# Patient Record
Sex: Female | Born: 1985 | Race: Black or African American | Hispanic: No | Marital: Single | Smoking: Former smoker
Health system: Southern US, Community
[De-identification: ages and names within clinical notes are randomized; demographics above are authoritative.]

## PROBLEM LIST (undated history)

## (undated) DIAGNOSIS — I10 Essential (primary) hypertension: Secondary | ICD-10-CM

## (undated) HISTORY — PX: DILATION AND CURETTAGE OF UTERUS: SHX78

## (undated) HISTORY — PX: TONSILLECTOMY AND ADENOIDECTOMY: SHX28

---
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
## 2007-01-10 ENCOUNTER — Emergency Department: Payer: Self-pay | Admitting: Emergency Medicine

## 2007-08-21 ENCOUNTER — Ambulatory Visit: Payer: Self-pay | Admitting: Family Medicine

## 2007-08-29 ENCOUNTER — Ambulatory Visit: Payer: Self-pay | Admitting: Family Medicine

## 2007-09-07 ENCOUNTER — Ambulatory Visit: Payer: Self-pay | Admitting: Family Medicine

## 2007-09-12 ENCOUNTER — Emergency Department: Payer: Self-pay | Admitting: Emergency Medicine

## 2007-10-08 ENCOUNTER — Ambulatory Visit: Payer: Self-pay | Admitting: Family Medicine

## 2007-11-07 ENCOUNTER — Ambulatory Visit: Payer: Self-pay | Admitting: Family Medicine

## 2007-11-21 ENCOUNTER — Observation Stay: Payer: Self-pay | Admitting: Obstetrics and Gynecology

## 2007-12-08 ENCOUNTER — Ambulatory Visit: Payer: Self-pay | Admitting: Family Medicine

## 2008-01-25 ENCOUNTER — Observation Stay: Payer: Self-pay

## 2008-02-24 ENCOUNTER — Inpatient Hospital Stay: Payer: Self-pay | Admitting: Unknown Physician Specialty

## 2011-01-27 ENCOUNTER — Emergency Department: Payer: Self-pay | Admitting: Emergency Medicine

## 2011-02-07 ENCOUNTER — Emergency Department: Payer: Self-pay | Admitting: *Deleted

## 2011-02-28 ENCOUNTER — Ambulatory Visit: Payer: Self-pay | Admitting: Obstetrics and Gynecology

## 2011-03-04 ENCOUNTER — Ambulatory Visit: Payer: Self-pay | Admitting: Obstetrics and Gynecology

## 2011-03-08 LAB — PATHOLOGY REPORT

## 2014-01-28 ENCOUNTER — Emergency Department: Payer: Self-pay | Admitting: Emergency Medicine

## 2014-01-31 ENCOUNTER — Emergency Department: Payer: Self-pay | Admitting: Emergency Medicine

## 2014-07-24 ENCOUNTER — Emergency Department: Payer: Self-pay | Admitting: Emergency Medicine

## 2014-10-03 ENCOUNTER — Encounter: Payer: Self-pay | Admitting: Emergency Medicine

## 2014-10-03 ENCOUNTER — Emergency Department
Admission: EM | Admit: 2014-10-03 | Discharge: 2014-10-03 | Disposition: A | Discharge status: Home / Self Care | Payer: Medicaid Other | Attending: Emergency Medicine | Admitting: Emergency Medicine

## 2014-10-03 DIAGNOSIS — J029 Acute pharyngitis, unspecified: Secondary | ICD-10-CM | POA: Diagnosis present

## 2014-10-03 DIAGNOSIS — Z87891 Personal history of nicotine dependence: Secondary | ICD-10-CM | POA: Diagnosis not present

## 2014-10-03 DIAGNOSIS — I10 Essential (primary) hypertension: Secondary | ICD-10-CM | POA: Insufficient documentation

## 2014-10-03 DIAGNOSIS — J069 Acute upper respiratory infection, unspecified: Secondary | ICD-10-CM | POA: Insufficient documentation

## 2014-10-03 DIAGNOSIS — H9203 Otalgia, bilateral: Secondary | ICD-10-CM | POA: Insufficient documentation

## 2014-10-03 DIAGNOSIS — Z79899 Other long term (current) drug therapy: Secondary | ICD-10-CM | POA: Diagnosis not present

## 2014-10-03 HISTORY — DX: Essential (primary) hypertension: I10

## 2014-10-03 MED ORDER — LORATADINE 10 MG PO TABS: 10.0000 mg | ORAL_TABLET | Freq: Every day | ORAL | Status: DC

## 2014-10-03 NOTE — ED Notes (Signed)
Pt states ear ache and sore throat x 2 weeks. Denies diff breathing, states pain when eating or swallowing. NAD noted. Pt alert and oriented at this time.

## 2014-10-03 NOTE — Discharge Instructions (Signed)
Upper Respiratory Infection, Adult °An upper respiratory infection (URI) is also sometimes known as the common cold. The upper respiratory tract includes the nose, sinuses, throat, trachea, and bronchi. Bronchi are the airways leading to the lungs. Most people improve within 1 week, but symptoms can last up to 2 weeks. A residual cough may last even longer.  °CAUSES °Many different viruses can infect the tissues lining the upper respiratory tract. The tissues become irritated and inflamed and often become very moist. Mucus production is also common. A cold is contagious. You can easily spread the virus to others by oral contact. This includes kissing, sharing a glass, coughing, or sneezing. Touching your mouth or nose and then touching a surface, which is then touched by another person, can also spread the virus. °SYMPTOMS  °Symptoms typically develop 1 to 3 days after you come in contact with a cold virus. Symptoms vary from person to person. They may include: °· Runny nose. °· Sneezing. °· Nasal congestion. °· Sinus irritation. °· Sore throat. °· Loss of voice (laryngitis). °· Cough. °· Fatigue. °· Muscle aches. °· Loss of appetite. °· Headache. °· Low-grade fever. °DIAGNOSIS  °You might diagnose your own cold based on familiar symptoms, since most people get a cold 2 to 3 times a year. Your caregiver can confirm this based on your exam. Most importantly, your caregiver can check that your symptoms are not due to another disease such as strep throat, sinusitis, pneumonia, asthma, or epiglottitis. Blood tests, throat tests, and X-rays are not necessary to diagnose a common cold, but they may sometimes be helpful in excluding other more serious diseases. Your caregiver will decide if any further tests are required. °RISKS AND COMPLICATIONS  °You may be at risk for a more severe case of the common cold if you smoke cigarettes, have chronic heart disease (such as heart failure) or lung disease (such as asthma), or if  you have a weakened immune system. The very young and very old are also at risk for more serious infections. Bacterial sinusitis, middle ear infections, and bacterial pneumonia can complicate the common cold. The common cold can worsen asthma and chronic obstructive pulmonary disease (COPD). Sometimes, these complications can require emergency medical care and may be life-threatening. °PREVENTION  °The best way to protect against getting a cold is to practice good hygiene. Avoid oral or hand contact with people with cold symptoms. Wash your hands often if contact occurs. There is no clear evidence that vitamin C, vitamin E, echinacea, or exercise reduces the chance of developing a cold. However, it is always recommended to get plenty of rest and practice good nutrition. °TREATMENT  °Treatment is directed at relieving symptoms. There is no cure. Antibiotics are not effective, because the infection is caused by a virus, not by bacteria. Treatment may include: °· Increased fluid intake. Sports drinks offer valuable electrolytes, sugars, and fluids. °· Breathing heated mist or steam (vaporizer or shower). °· Eating chicken soup or other clear broths, and maintaining good nutrition. °· Getting plenty of rest. °· Using gargles or lozenges for comfort. °· Controlling fevers with ibuprofen or acetaminophen as directed by your caregiver. °· Increasing usage of your inhaler if you have asthma. °Zinc gel and zinc lozenges, taken in the first 24 hours of the common cold, can shorten the duration and lessen the severity of symptoms. Pain medicines may help with fever, muscle aches, and throat pain. A variety of non-prescription medicines are available to treat congestion and runny nose. Your caregiver   can make recommendations and may suggest nasal or lung inhalers for other symptoms.  HOME CARE INSTRUCTIONS   Only take over-the-counter or prescription medicines for pain, discomfort, or fever as directed by your  caregiver.  Use a warm mist humidifier or inhale steam from a shower to increase air moisture. This may keep secretions moist and make it easier to breathe.  Drink enough water and fluids to keep your urine clear or pale yellow.  Rest as needed.  Return to work when your temperature has returned to normal or as your caregiver advises. You may need to stay home longer to avoid infecting others. You can also use a face mask and careful hand washing to prevent spread of the virus. SEEK MEDICAL CARE IF:   After the first few days, you feel you are getting worse rather than better.  You need your caregiver's advice about medicines to control symptoms.  You develop chills, worsening shortness of breath, or brown or red sputum. These may be signs of pneumonia.  You develop yellow or brown nasal discharge or pain in the face, especially when you bend forward. These may be signs of sinusitis.  You develop a fever, swollen neck glands, pain with swallowing, or white areas in the back of your throat. These may be signs of strep throat. SEEK IMMEDIATE MEDICAL CARE IF:   You have a fever.  You develop severe or persistent headache, ear pain, sinus pain, or chest pain.  You develop wheezing, a prolonged cough, cough up blood, or have a change in your usual mucus (if you have chronic lung disease).  You develop sore muscles or a stiff neck. Document Released: 10/19/2000 Document Revised: 07/18/2011 Document Reviewed: 07/31/2013 [facility] Patient Information 2015 [place]. This information is not intended to replace advice given to you by your health care provider. Make sure you discuss any questions you have with your health care provider.    FOLLOW UP WITH YOUR DOCTOR ABOUT YOUR BLOOD PRESSURE NEXT WEEK AND FOLLOW UP ABOUT YOUR EAR AND THROAT PAIN WITH YOUR DOCTOR

## 2014-10-03 NOTE — ED Provider Notes (Signed)
[facility] Emergency Department Provider Note  ____________________________________________  Time seen: Approximately 4:17 PM  I have reviewed the triage vital signs and the nursing notes.   HISTORY  Chief Complaint Otalgia and Sore Throat   HPI Amber Burnett is a 29 y.o. female states that her ears and throat have been hurting for approximately 2 weeks. She's been taking Tylenol for the pain without relief.She denies any fever, any difficulty swallowing, or any trouble speaking. Currently she rates her pain as 9 out of 10.   Past Medical History  Diagnosis Date  . HTN (hypertension)     There are no active problems to display for this patient.   History reviewed. No pertinent past surgical history.  Current Outpatient Rx  Name  Route  Sig  Dispense  Refill  . atenolol (TENORMIN) 25 MG tablet   Oral   Take by mouth daily.         . hydrochlorothiazide (HYDRODIURIL) 25 MG tablet   Oral   Take 25 mg by mouth daily.         Marland Kitchen loratadine (CLARITIN) 10 MG tablet   Oral   Take 1 tablet (10 mg total) by mouth daily.   30 tablet   2     Allergies Review of patient's allergies indicates no known allergies.  History reviewed. No pertinent family history.  Social History History  Substance Use Topics  . Smoking status: Former Games developer  . Smokeless tobacco: Never Used  . Alcohol Use: No    Review of Systems Constitutional: No fever/chills Eyes: No visual changes. ENT: Positive sore throat. Cardiovascular: Denies chest pain. Respiratory: Denies shortness of breath. Gastrointestinal: No abdominal pain.  No nausea, no vomiting.  No diarrhea.  No constipation. Skin: Negative for rash. Neurological: Negative for headaches, focal weakness or numbness.  10-point ROS otherwise negative.  ____________________________________________   PHYSICAL EXAM:  VITAL SIGNS: ED Triage Vitals  Enc Vitals Group     BP 10/03/14 1542 154/103  mmHg     Pulse Rate 10/03/14 1542 82     Resp 10/03/14 1542 20     Temp 10/03/14 1542 98.3 F (36.8 C)     Temp Source 10/03/14 1542 Oral     SpO2 10/03/14 1542 100 %     Weight 10/03/14 1542 245 lb (111.131 kg)     Height 10/03/14 1542  (1.6 m)     Head Cir --      Peak Flow --      Pain Score 10/03/14 1542 9     Pain Loc --      Pain Edu? --      Excl. in GC? --     Constitutional: Alert and oriented. Well appearing and in no acute distress. Eyes: Conjunctivae are normal. PERRL. EOMI. Head: Atraumatic. Nose: No congestion/rhinnorhea. Nose mucosa is pale and boggy. Ears bilaterally no erythema. Mouth/Throat: Mucous membranes are moist.  Oropharynx non-erythematous. Neck: No stridor.   Hematological/Lymphatic/Immunilogical: No cervical lymphadenopathy. Cardiovascular: Normal rate, regular rhythm. Grossly normal heart sounds.  Good peripheral circulation. Respiratory: Normal respiratory effort.  No retractions. Lungs CTAB. Gastrointestinal: Soft and nontender. No distention.  Musculoskeletal: No lower extremity tenderness nor edema.  No joint effusions. Neurologic:  Normal speech and language. No gross focal neurologic deficits are appreciated. Speech is normal. No gait instability. Skin:  Skin is warm, dry and intact. No rash noted. Psychiatric: Mood and affect are normal. Speech and behavior are normal.  ____________________________________________  LABS (all labs ordered are listed, but only abnormal results are displayed)  Labs Reviewed - No data to display  PROCEDURES  Procedure(s) performed: None  Critical Care performed: No  ____________________________________________   INITIAL IMPRESSION / ASSESSMENT AND PLAN / ED COURSE  Pertinent labs & imaging results that were available during my care of the patient were reviewed by me and considered in my medical decision making (see chart for details).  She was given a prescription for Claritin one every day.  He is to follow-up with her doctor if any continued problems. Her blood pressure today was elevated. She states she is taking blood pressure medication and will also follow-up with her doctor next week for that. ____________________________________________   FINAL CLINICAL IMPRESSION(S) / ED DIAGNOSES  Final diagnoses:  URI (upper respiratory infection)      Tommi RumpsRhonda L Summers, PA-C 10/03/14 1635  Tommi Rumpshonda L Summers, PA-C 10/03/14 1643 Is available for consult at a time the patient is here  Arnaldo NatalPaul F Malinda, MD 10/03/14 1806

## 2014-11-07 ENCOUNTER — Encounter: Payer: Self-pay | Admitting: Emergency Medicine

## 2014-11-07 ENCOUNTER — Emergency Department
Admission: EM | Admit: 2014-11-07 | Discharge: 2014-11-07 | Discharge status: Against Medical Advice | Payer: Medicaid Other | Attending: Emergency Medicine | Admitting: Emergency Medicine

## 2014-11-07 DIAGNOSIS — N939 Abnormal uterine and vaginal bleeding, unspecified: Secondary | ICD-10-CM | POA: Diagnosis present

## 2014-11-07 LAB — URINALYSIS COMPLETE WITH MICROSCOPIC (ARMC ONLY)
Bacteria, UA: NONE SEEN
Bilirubin Urine: NEGATIVE
Glucose, UA: NEGATIVE mg/dL
Ketones, ur: NEGATIVE mg/dL
Leukocytes, UA: NEGATIVE
Nitrite: NEGATIVE
PROTEIN: 100 mg/dL — AB
SPECIFIC GRAVITY, URINE: 1.025 (ref 1.005–1.030)
pH: 6 (ref 5.0–8.0)

## 2014-11-07 NOTE — ED Notes (Signed)
Reports vag bleeding x 2 wks, heavier than usual with clots

## 2016-06-23 ENCOUNTER — Encounter: Payer: Self-pay | Admitting: *Deleted

## 2016-06-23 DIAGNOSIS — R509 Fever, unspecified: Secondary | ICD-10-CM | POA: Insufficient documentation

## 2016-06-23 DIAGNOSIS — Z5321 Procedure and treatment not carried out due to patient leaving prior to being seen by health care provider: Secondary | ICD-10-CM | POA: Diagnosis not present

## 2016-06-23 DIAGNOSIS — M791 Myalgia: Secondary | ICD-10-CM | POA: Diagnosis not present

## 2016-06-23 DIAGNOSIS — R05 Cough: Secondary | ICD-10-CM | POA: Insufficient documentation

## 2016-06-23 DIAGNOSIS — I10 Essential (primary) hypertension: Secondary | ICD-10-CM | POA: Diagnosis not present

## 2016-06-23 DIAGNOSIS — Z87891 Personal history of nicotine dependence: Secondary | ICD-10-CM | POA: Diagnosis not present

## 2016-06-23 NOTE — ED Triage Notes (Signed)
Pt reports cough, chills, fever and bodyaches for 1 week.  No otc meds.  Pt alert

## 2016-06-24 ENCOUNTER — Emergency Department
Admission: EM | Admit: 2016-06-24 | Discharge: 2016-06-24 | Disposition: A | Discharge status: Home / Self Care | Payer: Medicaid Other | Attending: Emergency Medicine | Admitting: Emergency Medicine

## 2016-08-29 ENCOUNTER — Telehealth: Payer: Self-pay | Admitting: Obstetrics & Gynecology

## 2016-08-29 NOTE — Telephone Encounter (Signed)
Pt is [facility] For Post[facility] Bleeding. At this time we are not excepting New medicaid patient. Unable to schedule at this time

## 2016-08-29 NOTE — Telephone Encounter (Signed)
Pt is being referred By [facility] for Menorrhagia. Correction to referral

## 2016-09-12 ENCOUNTER — Emergency Department
Admission: EM | Admit: 2016-09-12 | Discharge: 2016-09-12 | Disposition: A | Discharge status: Home / Self Care | Payer: Medicaid Other | Attending: Emergency Medicine | Admitting: Emergency Medicine

## 2016-09-12 ENCOUNTER — Encounter: Payer: Self-pay | Admitting: Emergency Medicine

## 2016-09-12 DIAGNOSIS — I10 Essential (primary) hypertension: Secondary | ICD-10-CM | POA: Insufficient documentation

## 2016-09-12 DIAGNOSIS — N938 Other specified abnormal uterine and vaginal bleeding: Secondary | ICD-10-CM | POA: Diagnosis not present

## 2016-09-12 DIAGNOSIS — N939 Abnormal uterine and vaginal bleeding, unspecified: Secondary | ICD-10-CM | POA: Diagnosis present

## 2016-09-12 DIAGNOSIS — Z79899 Other long term (current) drug therapy: Secondary | ICD-10-CM | POA: Insufficient documentation

## 2016-09-12 DIAGNOSIS — Z87891 Personal history of nicotine dependence: Secondary | ICD-10-CM | POA: Diagnosis not present

## 2016-09-12 LAB — POCT PREGNANCY, URINE: Preg Test, Ur: NEGATIVE

## 2016-09-12 LAB — CBC
HEMATOCRIT: 38 % (ref 35.0–47.0)
HEMOGLOBIN: 12.7 g/dL (ref 12.0–16.0)
MCH: 28.1 pg (ref 26.0–34.0)
MCHC: 33.3 g/dL (ref 32.0–36.0)
MCV: 84.4 fL (ref 80.0–100.0)
PLATELETS: 287 10*3/uL (ref 150–440)
RBC: 4.5 MIL/uL (ref 3.80–5.20)
RDW: 14.2 % (ref 11.5–14.5)
WBC: 5.6 10*3/uL (ref 3.6–11.0)

## 2016-09-12 MED ORDER — MEDROXYPROGESTERONE ACETATE 10 MG PO TABS: 20.0000 mg | ORAL_TABLET | Freq: Every day | ORAL | 0 refills | Status: DC

## 2016-09-12 NOTE — ED Triage Notes (Signed)
Pt c/o vaginal bleeding, approx 1 pad per hours for approx 1 week. Pt reports she has hx of this and it was diagnosed as a polyp before. Reports cramping. No syncope or dizziness. Pt alert and oriented X4, active, cooperative, pt in NAD. RR even and unlabored, color WNL.

## 2016-09-12 NOTE — ED Notes (Signed)
Pt ambulatory to ER. Pt alert and oriented X4, active, cooperative, pt in NAD. RR even and unlabored, color WNL.

## 2016-09-12 NOTE — ED Provider Notes (Signed)
[facility] Emergency Department Provider Note  ____________________________________________  Time seen: Approximately 5:52 PM  I have reviewed the triage vital signs and the nursing notes.   HISTORY  Chief Complaint Vaginal Bleeding    HPI Amber Burnett is a 31 y.o. female who complains of irregular vaginal bleeding. She states that she has irregular periods sometimes going a few months or a two-year between periods ever since she had a Mirena IUD. Denies any breast tenderness or enlargement. No cramping pelvic pain or back pain. She's been having vaginal bleeding for the last week. She noticed clots and having to change her pad frequently. It is not worsening, somewhat improved today. No vomiting fevers chills chest pain shortness of breath dizziness or syncope.  Prior to current bleeding episode last period was 2 months ago.  Patient has a follow-up appointment with gynecology in one week. She reports a strong family history of uterine fibroids and a previous diagnosis of uterine fibroid herself.   Past Medical History:  Diagnosis Date  . HTN (hypertension)      There are no active problems to display for this patient.    History reviewed. No pertinent surgical history.   Prior to Admission medications   Medication Sig Start Date End Date Taking? Authorizing Provider  atenolol (TENORMIN) 25 MG tablet Take by mouth daily.    [provider]  hydrochlorothiazide (HYDRODIURIL) 25 MG tablet Take 25 mg by mouth daily.    [provider]  loratadine (CLARITIN) 10 MG tablet Take 1 tablet (10 mg total) by mouth daily. 10/03/14 10/03/15  Tommi RumpsSummers, Rhonda L, PA-C  medroxyPROGESTERone (PROVERA) 10 MG tablet Take 2 tablets (20 mg total) by mouth daily. 09/12/16   Sharman CheekStafford, Madelina Sanda, MD     Allergies Patient has no known allergies.   No family history on file.  Social History Social History  Substance Use Topics  . Smoking status:  Former Games developermoker  . Smokeless tobacco: Never Used  . Alcohol use No    Review of Systems  Constitutional:   No fever or chills.  ENT:   No sore throat. No rhinorrhea. Lymphatic: No swollen glands, No extremity swelling Endocrine: No hot/cold flashes. No significant weight change. No neck swelling. Cardiovascular:   No chest pain or syncope. Respiratory:   No dyspnea or cough. Gastrointestinal:   Negative for abdominal pain, vomiting and diarrhea.  Genitourinary:   Negative for dysuria or difficulty urinating.Positive vaginal bleeding Musculoskeletal:   Negative for focal pain or swelling Neurological:   Negative for headaches or weakness. All other systems reviewed and are negative except as documented above in ROS and HPI.  ____________________________________________   PHYSICAL EXAM:  VITAL SIGNS: ED Triage Vitals  Enc Vitals Group     BP 09/12/16 1231 (!) 151/103     Pulse Rate 09/12/16 1231 82     Resp 09/12/16 1231 20     Temp 09/12/16 1231 98.8 F (37.1 C)     Temp Source 09/12/16 1231 Oral     SpO2 09/12/16 1231 97 %     Weight 09/12/16 1231 292 lb 4.8 oz (132.6 kg)     Height 09/12/16 1231 5\' 3"  (1.6 m)     Head Circumference --      Peak Flow --      Pain Score 09/12/16 1240 10     Pain Loc --      Pain Edu? --      Excl. in GC? --  Vital signs reviewed, nursing assessments reviewed.   Constitutional:   Alert and oriented. Well appearing and in no distress. Eyes:   No scleral icterus. No conjunctival pallor. PERRL. EOMI.  No nystagmus. ENT   Head:   Normocephalic and atraumatic.   Nose:   No congestion/rhinnorhea. No septal hematoma   Mouth/Throat:   MMM, no pharyngeal erythema. No peritonsillar mass.    Neck:   No stridor. No SubQ emphysema. No meningismus. Hematological/Lymphatic/Immunilogical:   No cervical lymphadenopathy. Cardiovascular:   RRR. Symmetric bilateral radial and DP pulses.  No murmurs.  Respiratory:   Normal respiratory  effort without tachypnea nor retractions. Breath sounds are clear and equal bilaterally. No wheezes/rales/rhonchi. Gastrointestinal:   Soft and nontender. Non distended. There is no CVA tenderness.  No rebound, rigidity, or guarding. Genitourinary:   deferred Musculoskeletal:   Normal range of motion in all extremities. No joint effusions.  No lower extremity tenderness.  No edema. Neurologic:   Normal speech and language.  CN 2-10 normal. Motor grossly intact. No gross focal neurologic deficits are appreciated.  Skin:    Skin is warm, dry and intact. No rash noted.  No petechiae, purpura, or bullae.  ____________________________________________    LABS (pertinent positives/negatives) (all labs ordered are listed, but only abnormal results are displayed) Labs Reviewed  CBC  POC URINE PREG, ED   ____________________________________________   EKG    ____________________________________________    RADIOLOGY  No results found.  ____________________________________________   PROCEDURES Procedures  ____________________________________________   INITIAL IMPRESSION / ASSESSMENT AND PLAN / ED COURSE  Pertinent labs & imaging results that were available during my care of the patient were reviewed by me and considered in my medical decision making (see chart for details).  Patient well appearing no acute distress, normal vital signs, no worrisome symptoms. Presents with vaginal bleeding for a week. Will start her on a week of Provera. Follow up with gynecology as scheduled in 8 days on May 15. Return precautions.  We'll ensure she is not pregnant prior to discharge.       ____________________________________________   FINAL CLINICAL IMPRESSION(S) / ED DIAGNOSES  Final diagnoses:  Dysfunctional uterine bleeding      New Prescriptions   MEDROXYPROGESTERONE (PROVERA) 10 MG TABLET    Take 2 tablets (20 mg total) by mouth daily.     Portions of this note were  generated with dragon dictation software. Dictation errors may occur despite best attempts at proofreading.    Sharman Cheek, MD 09/12/16 1755

## 2017-07-17 ENCOUNTER — Emergency Department
Admission: EM | Admit: 2017-07-17 | Discharge: 2017-07-17 | Disposition: A | Discharge status: Home / Self Care | Payer: Medicaid Other | Attending: Emergency Medicine | Admitting: Emergency Medicine

## 2017-07-17 ENCOUNTER — Encounter: Payer: Self-pay | Admitting: Emergency Medicine

## 2017-07-17 ENCOUNTER — Emergency Department: Payer: Medicaid Other

## 2017-07-17 DIAGNOSIS — J40 Bronchitis, not specified as acute or chronic: Secondary | ICD-10-CM

## 2017-07-17 DIAGNOSIS — Z87891 Personal history of nicotine dependence: Secondary | ICD-10-CM | POA: Diagnosis not present

## 2017-07-17 DIAGNOSIS — Z79899 Other long term (current) drug therapy: Secondary | ICD-10-CM | POA: Insufficient documentation

## 2017-07-17 DIAGNOSIS — R05 Cough: Secondary | ICD-10-CM | POA: Diagnosis present

## 2017-07-17 DIAGNOSIS — I10 Essential (primary) hypertension: Secondary | ICD-10-CM | POA: Diagnosis not present

## 2017-07-17 MED ORDER — AZITHROMYCIN 250 MG PO TABS: ORAL_TABLET | ORAL | 0 refills | Status: AC

## 2017-07-17 MED ORDER — SULFAMETHOXAZOLE-TRIMETHOPRIM 800-160 MG PO TABS
1.0000 | ORAL_TABLET | Freq: Once | ORAL | Status: AC
  Administered 2017-07-17: 1 via ORAL
  Filled 2017-07-17: qty 1

## 2017-07-17 MED ORDER — BENZONATATE 100 MG PO CAPS
200.0000 mg | ORAL_CAPSULE | Freq: Once | ORAL | Status: AC
  Administered 2017-07-17: 200 mg via ORAL
  Filled 2017-07-17: qty 2

## 2017-07-17 MED ORDER — PSEUDOEPH-BROMPHEN-DM 30-2-10 MG/5ML PO SYRP: 5.0000 mL | ORAL_SOLUTION | Freq: Four times a day (QID) | ORAL | 0 refills | Status: DC | PRN

## 2017-07-17 MED ORDER — NAPROXEN 500 MG PO TABS: 500.0000 mg | ORAL_TABLET | Freq: Two times a day (BID) | ORAL | 0 refills | Status: AC

## 2017-07-17 MED ORDER — NAPROXEN 500 MG PO TABS
500.0000 mg | ORAL_TABLET | Freq: Once | ORAL | Status: AC
  Administered 2017-07-17: 500 mg via ORAL
  Filled 2017-07-17: qty 1

## 2017-07-17 NOTE — ED Triage Notes (Signed)
Pt reports cough, congestion, bodyaches and fever since the weekend. Pt concerned she has the flu.

## 2017-07-17 NOTE — ED Notes (Signed)
See triage note  Presents with body aches and low grade fever since Friday  Afebrile on arrival

## 2017-07-17 NOTE — Discharge Instructions (Signed)
Recommended 3-day blood pressure check secondary to elevated readings today of 160/101.

## 2017-07-17 NOTE — ED Provider Notes (Signed)
[facility] Emergency Department Provider Note   ____________________________________________   First MD Initiated Contact with Patient 07/17/17 1509     (approximate)  I have reviewed the triage vital signs and the nursing notes.   HISTORY  Chief Complaint Influenza; Cough; Fever; and Generalized Body Aches    HPI Amber Burnett is a 32 y.o. female patient complain of cough, fever, and body aches for 3 days.  Patient denies nausea, vomiting, diarrhea.  Patient has not taken a flu shot for this season.  Patient rates the pain as a 7/10.  No palliative measure for complaint.  Patient BP was elevated in triage.  Patient has a history of hypertension but states she was told to discontinue blood pressure medicine 1-2 years ago.  Past Medical History:  Diagnosis Date  . HTN (hypertension)     There are no active problems to display for this patient.   History reviewed. No pertinent surgical history.  Prior to Admission medications   Medication Sig Start Date End Date Taking? Authorizing Provider  atenolol (TENORMIN) 25 MG tablet Take by mouth daily.    [provider]  azithromycin (ZITHROMAX Z-PAK) 250 MG tablet Take 2 tablets (500 mg) on  Day 1,  followed by 1 tablet (250 mg) once daily on Days 2 through 5. 07/17/17 07/22/17  Joni Reining, PA-C  brompheniramine-pseudoephedrine-DM 30-2-10 MG/5ML syrup Take 5 mLs by mouth 4 (four) times daily as needed. 07/17/17   Joni Reining, PA-C  hydrochlorothiazide (HYDRODIURIL) 25 MG tablet Take 25 mg by mouth daily.    [provider]  loratadine (CLARITIN) 10 MG tablet Take 1 tablet (10 mg total) by mouth daily. 10/03/14 10/03/15  Tommi Rumps, PA-C  medroxyPROGESTERone (PROVERA) 10 MG tablet Take 2 tablets (20 mg total) by mouth daily. 09/12/16   Sharman Cheek, MD  naproxen (NAPROSYN) 500 MG tablet Take 1 tablet (500 mg total) by mouth 2 (two) times daily with a meal. 07/17/17 07/17/18   Joni Reining, PA-C    Allergies Patient has no known allergies.  No family history on file.  Social History Social History   Tobacco Use  . Smoking status: Former Games developer  . Smokeless tobacco: Never Used  Substance Use Topics  . Alcohol use: No  . Drug use: No    Review of Systems Constitutional: No fever/chills Eyes: No visual changes. ENT: No sore throat.  Nasal congestion. Cardiovascular: Denies chest pain. Respiratory: Denies shortness of breath.  Productive cough. Gastrointestinal: No abdominal pain.  No nausea, no vomiting.  No diarrhea.  No constipation. Genitourinary: Negative for dysuria. Musculoskeletal: Negative for back pain. Skin: Negative for rash. Neurological: Positive for frontal headache headaches, denies focal weakness or numbness. ____________________________________________   PHYSICAL EXAM:  VITAL SIGNS: ED Triage Vitals  Enc Vitals Group     BP 07/17/17 1405 (!) 160/101     Pulse Rate 07/17/17 1405 89     Resp 07/17/17 1405 20     Temp 07/17/17 1405 98.8 F (37.1 C)     Temp Source 07/17/17 1405 Oral     SpO2 07/17/17 1405 97 %     Weight 07/17/17 1406 295 lb (133.8 kg)     Height 07/17/17 1406 5\' 3"  (1.6 m)     Head Circumference --      Peak Flow --      Pain Score 07/17/17 1406 7     Pain Loc --      Pain Edu? --  Excl. in GC? --    Constitutional: Alert and oriented. Well appearing and in no acute distress. Nose: Edematous nasal turbinates clear rhinorrhea. Mouth/Throat: Mucous membranes are moist.  Oropharynx non-erythematous.  Postnasal drainage. Neck: No stridor.  No cervical spine tenderness to palpation. Hematological/Lymphatic/Immunilogical: No cervical lymphadenopathy. Cardiovascular: Normal rate, regular rhythm. Grossly normal heart sounds.  Good peripheral circulation.  Elevated blood pressure Respiratory: Normal respiratory effort.  No retractions. Lungs left rales. Gastrointestinal: Soft and nontender. No  distention. No abdominal bruits. No CVA tenderness. Musculoskeletal: No lower extremity tenderness nor edema.  No joint effusions. Neurologic:  Normal speech and language. No gross focal neurologic deficits are appreciated. No gait instability. Skin:  Skin is warm, dry and intact. No rash noted. Psychiatric: Mood and affect are normal. Speech and behavior are normal.  ____________________________________________   LABS (all labs ordered are listed, but only abnormal results are displayed)  Labs Reviewed - No data to display ____________________________________________  EKG   ____________________________________________  RADIOLOGY  ED MD interpretation:   No acute findings on chest x-ray.  Official radiology report(s): Dg Chest 2 View  Result Date: 07/17/2017 CLINICAL DATA:  Cough, fever. EXAM: CHEST - 2 VIEW COMPARISON:  Radiographs of August 25, 2005. FINDINGS: The heart size and mediastinal contours are within normal limits. Both lungs are clear. No pneumothorax or pleural effusion is noted. The visualized skeletal structures are unremarkable. IMPRESSION: No active cardiopulmonary disease. Electronically Signed   By: Lupita RaiderJames  Green Jr, M.D.   On: 07/17/2017 15:31    ____________________________________________   PROCEDURES  Procedure(s) performed: None  Procedures  Critical Care performed: No  ____________________________________________   INITIAL IMPRESSION / ASSESSMENT AND PLAN / ED COURSE  As part of my medical decision making, I reviewed the following data within the electronic MEDICAL RECORD NUMBER    Respiratory infection secondary to bronchitis.  Discussed x-ray findings with patient.  Patient given discharge care instruction advised take medication as directed.  Patient advised to follow-up PCP for further evaluation of elevated blood pressure readings a day.      ____________________________________________   FINAL CLINICAL IMPRESSION(S) / ED  DIAGNOSES  Final diagnoses:  Bronchitis     ED Discharge Orders        Ordered    azithromycin (ZITHROMAX Z-PAK) 250 MG tablet     07/17/17 1554    naproxen (NAPROSYN) 500 MG tablet  2 times daily with meals     07/17/17 1554    brompheniramine-pseudoephedrine-DM 30-2-10 MG/5ML syrup  4 times daily PRN     07/17/17 1554       Note:  This document was prepared using Dragon voice recognition software and may include unintentional dictation errors.    Joni ReiningSmith, Ronald K, PA-C 07/17/17 1559    Dionne BucySiadecki, Sebastian, MD 07/17/17 1704

## 2018-06-07 ENCOUNTER — Emergency Department
Admission: EM | Admit: 2018-06-07 | Discharge: 2018-06-07 | Disposition: A | Discharge status: Home / Self Care | Payer: Medicaid Other | Attending: Emergency Medicine | Admitting: Emergency Medicine

## 2018-06-07 ENCOUNTER — Other Ambulatory Visit: Payer: Self-pay

## 2018-06-07 ENCOUNTER — Encounter: Payer: Self-pay | Admitting: Emergency Medicine

## 2018-06-07 DIAGNOSIS — R05 Cough: Secondary | ICD-10-CM | POA: Diagnosis not present

## 2018-06-07 DIAGNOSIS — J069 Acute upper respiratory infection, unspecified: Secondary | ICD-10-CM | POA: Insufficient documentation

## 2018-06-07 DIAGNOSIS — Z87891 Personal history of nicotine dependence: Secondary | ICD-10-CM | POA: Insufficient documentation

## 2018-06-07 DIAGNOSIS — I10 Essential (primary) hypertension: Secondary | ICD-10-CM | POA: Diagnosis not present

## 2018-06-07 DIAGNOSIS — R0981 Nasal congestion: Secondary | ICD-10-CM | POA: Diagnosis present

## 2018-06-07 DIAGNOSIS — Z79899 Other long term (current) drug therapy: Secondary | ICD-10-CM | POA: Diagnosis not present

## 2018-06-07 DIAGNOSIS — R519 Headache, unspecified: Secondary | ICD-10-CM

## 2018-06-07 DIAGNOSIS — R51 Headache: Secondary | ICD-10-CM | POA: Diagnosis not present

## 2018-06-07 LAB — BASIC METABOLIC PANEL
Anion gap: 7 (ref 5–15)
BUN: 16 mg/dL (ref 6–20)
CO2: 25 mmol/L (ref 22–32)
Calcium: 9 mg/dL (ref 8.9–10.3)
Chloride: 106 mmol/L (ref 98–111)
Creatinine, Ser: 0.72 mg/dL (ref 0.44–1.00)
GFR calc Af Amer: >60 mL/min (ref >60)
GFR calc non Af Amer: >60 mL/min (ref >60)
Glucose, Bld: 99 mg/dL (ref 70–99)
POTASSIUM: 3.8 mmol/L (ref 3.5–5.1)
Sodium: 138 mmol/L (ref 135–145)

## 2018-06-07 LAB — CBC
HCT: 41.6 % (ref 36.0–46.0)
Hemoglobin: 13.4 g/dL (ref 12.0–15.0)
MCH: 27.7 pg (ref 26.0–34.0)
MCHC: 32.2 g/dL (ref 30.0–36.0)
MCV: 86.1 fL (ref 80.0–100.0)
Platelets: 307 10*3/uL (ref 150–400)
RBC: 4.83 MIL/uL (ref 3.87–5.11)
RDW: 14.4 % (ref 11.5–15.5)
WBC: 7.2 10*3/uL (ref 4.0–10.5)
nRBC: 0 % (ref 0.0–0.2)

## 2018-06-07 LAB — URINALYSIS, COMPLETE (UACMP) WITH MICROSCOPIC
BILIRUBIN URINE: NEGATIVE
Glucose, UA: NEGATIVE mg/dL
Ketones, ur: NEGATIVE mg/dL
Leukocytes, UA: NEGATIVE
NITRITE: NEGATIVE
Protein, ur: NEGATIVE mg/dL
Specific Gravity, Urine: 1.021 (ref 1.005–1.030)
pH: 5 (ref 5.0–8.0)

## 2018-06-07 LAB — POCT PREGNANCY, URINE: PREG TEST UR: NEGATIVE

## 2018-06-07 MED ORDER — HYDROCHLOROTHIAZIDE 25 MG PO TABS: 25.0000 mg | ORAL_TABLET | Freq: Every day | ORAL | 2 refills | Status: AC

## 2018-06-07 MED ORDER — BUTALBITAL-APAP-CAFFEINE 50-325-40 MG PO TABS: 1.0000 | ORAL_TABLET | Freq: Four times a day (QID) | ORAL | 0 refills | Status: DC | PRN

## 2018-06-07 NOTE — ED Triage Notes (Signed)
Says headache and feels weak since Monday.  Says body feels weird.

## 2018-06-07 NOTE — ED Provider Notes (Signed)
[facility] Emergency Department Provider Note  Time seen: 2:17 PM  I have reviewed the triage vital signs and the nursing notes.   HISTORY  Chief Complaint Headache and Weakness    HPI Amber Burnett is a 33 y.o. female with a past medical history of hypertension presents to the emergency department for congestion and headache for the past 3 to 4 days but according to the patient she has had congestion with a slight cough over the past several days along with the intermittent headache.  Patient states she has had this headache before when her blood pressure goes up, used to be on hydrochlorothiazide but is no longer on this medication because she does not follow-up with a primary care doctor currently.  Patient denies any fever, denies any abdominal pain or chest pain.  Overall the patient appears very well with a overall negative review of systems otherwise.   Past Medical History:  Diagnosis Date  . HTN (hypertension)     There are no active problems to display for this patient.   History reviewed. No pertinent surgical history.  Prior to Admission medications   Medication Sig Start Date End Date Taking? Authorizing Provider  atenolol (TENORMIN) 25 MG tablet Take by mouth daily.    [provider]  brompheniramine-pseudoephedrine-DM 30-2-10 MG/5ML syrup Take 5 mLs by mouth 4 (four) times daily as needed. 07/17/17   Joni Reining, PA-C  hydrochlorothiazide (HYDRODIURIL) 25 MG tablet Take 25 mg by mouth daily.    [provider]  loratadine (CLARITIN) 10 MG tablet Take 1 tablet (10 mg total) by mouth daily. 10/03/14 10/03/15  Tommi Rumps, PA-C  medroxyPROGESTERone (PROVERA) 10 MG tablet Take 2 tablets (20 mg total) by mouth daily. 09/12/16   Sharman Cheek, MD  naproxen (NAPROSYN) 500 MG tablet Take 1 tablet (500 mg total) by mouth 2 (two) times daily with a meal. 07/17/17 07/17/18  Joni Reining, PA-C    No Known  Allergies  No family history on file.  Social History Social History   Tobacco Use  . Smoking status: Former Games developer  . Smokeless tobacco: Never Used  Substance Use Topics  . Alcohol use: No  . Drug use: No    Review of Systems Constitutional: Negative for fever. ENT: Mild congestion x3 to 4 days Cardiovascular: Negative for chest pain. Respiratory: Negative for shortness of breath.  Occasional cough x3 to 4 days Gastrointestinal: Negative for abdominal pain, vomiting and diarrhea. Genitourinary: Negative for urinary compaints Musculoskeletal: Negative for musculoskeletal complaints Skin: Negative for skin complaints  Neurological: Intermittent headache x3 to 4 days All other ROS negative  ____________________________________________   PHYSICAL EXAM:  VITAL SIGNS: ED Triage Vitals  Enc Vitals Group     BP 06/07/18 1115 (!) 147/97     Pulse Rate 06/07/18 1115 84     Resp 06/07/18 1115 16     Temp 06/07/18 1115 97.9 F (36.6 C)     Temp Source 06/07/18 1115 Oral     SpO2 06/07/18 1115 97 %     Weight 06/07/18 1116 295 lb (133.8 kg)     Height 06/07/18 1116 5\' 3"  (1.6 m)     Head Circumference --      Peak Flow --      Pain Score 06/07/18 1116 8     Pain Loc --      Pain Edu? --      Excl. in GC? --    Constitutional: Alert  and oriented. Well appearing and in no distress. Eyes: Normal exam ENT   Head: Normocephalic and atraumatic.   Mouth/Throat: Mucous membranes are moist. Cardiovascular: Normal rate, regular rhythm. No murmur Respiratory: Normal respiratory effort without tachypnea nor retractions. Breath sounds are clear  Gastrointestinal: Soft and nontender. No distention Musculoskeletal: Nontender with normal range of motion in all extremities.  Neurologic:  Normal speech and language. No gross focal neurologic deficits Skin:  Skin is warm, dry and intact.  Psychiatric: Mood and affect are normal.   ____________________________________________     EKG  EKG viewed and interpreted by myself shows a normal sinus rhythm at 68 bpm with a narrow QRS, normal axis, normal intervals, no concerning ST changes.  ____________________________________________  INITIAL IMPRESSION / ASSESSMENT AND PLAN / ED COURSE  Pertinent labs & imaging results that were available during my care of the patient were reviewed by me and considered in my medical decision making (see chart for details).  Patient presents to the emergency department for intermittent congestion and headache over the past 3 or 4 days along with elevated blood pressure.  Currently 147/97, states she used to be on hydrochlorothiazide but is not followed up with the PCP recently.  Overall the patient appears extremely well, examination she does have mild congestion but otherwise normal physical exam.  Overall the patient appears very well, wishes to get back on a blood pressure medication which I believe is reasonable given her elevation in blood pressure currently.  We will start the patient back on hydrochlorothiazide 25 mg daily and have the patient follow-up with a primary care doctor in the next 1 month for recheck/reevaluation.  Patient agreeable to this plan of care.  Given the patient's intermittent headache over the past several days we will also discharge with a short course of Fioricet, patient agreeable to plan of care.  ____________________________________________   FINAL CLINICAL IMPRESSION(S) / ED DIAGNOSES  Upper respiratory infection Headache Hypertension   Minna AntisPaduchowski, Khalilah Hoke, MD 06/07/18 1420

## 2018-06-07 NOTE — ED Notes (Signed)

## 2019-01-02 ENCOUNTER — Other Ambulatory Visit: Payer: Self-pay

## 2019-01-02 ENCOUNTER — Emergency Department
Admission: EM | Admit: 2019-01-02 | Discharge: 2019-01-02 | Disposition: A | Discharge status: Home / Self Care | Payer: Medicaid Other | Attending: Emergency Medicine | Admitting: Emergency Medicine

## 2019-01-02 ENCOUNTER — Emergency Department: Payer: Medicaid Other

## 2019-01-02 DIAGNOSIS — R51 Headache: Secondary | ICD-10-CM | POA: Diagnosis not present

## 2019-01-02 DIAGNOSIS — Z87891 Personal history of nicotine dependence: Secondary | ICD-10-CM | POA: Diagnosis not present

## 2019-01-02 DIAGNOSIS — Z79899 Other long term (current) drug therapy: Secondary | ICD-10-CM | POA: Diagnosis not present

## 2019-01-02 DIAGNOSIS — I10 Essential (primary) hypertension: Secondary | ICD-10-CM | POA: Diagnosis not present

## 2019-01-02 DIAGNOSIS — R519 Headache, unspecified: Secondary | ICD-10-CM

## 2019-01-02 MED ORDER — AMLODIPINE BESYLATE 5 MG PO TABS: 5.0000 mg | ORAL_TABLET | Freq: Every day | ORAL | 1 refills | Status: DC

## 2019-01-02 MED ORDER — KETOROLAC TROMETHAMINE 30 MG/ML IJ SOLN
30.0000 mg | Freq: Once | INTRAMUSCULAR | Status: AC
  Administered 2019-01-02: 30 mg via INTRAMUSCULAR
  Filled 2019-01-02: qty 1

## 2019-01-02 MED ORDER — NAPROXEN 500 MG PO TABS: 500.0000 mg | ORAL_TABLET | Freq: Two times a day (BID) | ORAL | 2 refills | Status: DC

## 2019-01-02 NOTE — ED Notes (Signed)
md in with pt

## 2019-01-02 NOTE — ED Notes (Signed)
Pt has a headache.  Pt reports h/a's for 1 month.  No n/v.  Pt states her blood pressure has been elevated at times.  Pt alert  Speech clear.

## 2019-01-02 NOTE — ED Triage Notes (Signed)
Pt in with co headache for a month, has hx of migraines but not persistent. Pt has taken otc tylenol wtihout relief.

## 2019-01-02 NOTE — ED Provider Notes (Signed)
[facility] Emergency Department Provider Note   ____________________________________________    I have reviewed the triage vital signs and the nursing notes.   HISTORY  Chief Complaint Headache     HPI Amber Burnett is a 33 y.o. female who presents with complaints of a headache.  Patient describes headache x1 month.  She reports it is worse in the mornings and in the evenings but seems to get better during the day.  She denies neuro deficits.  No fevers or chills.  No neck pain.  No blood thinners.  No trauma.  No nausea or vomiting.  Has not take anything for it.  Past Medical History:  Diagnosis Date  . HTN (hypertension)     There are no active problems to display for this patient.   No past surgical history on file.  Prior to Admission medications   Medication Sig Start Date End Date Taking? Authorizing Provider  amLODipine (NORVASC) 5 MG tablet Take 1 tablet (5 mg total) by mouth daily. 01/02/19 03/03/19  Jene EveryKinner, Matilde Pottenger, MD  atenolol (TENORMIN) 25 MG tablet Take by mouth daily.    [provider]  brompheniramine-pseudoephedrine-DM 30-2-10 MG/5ML syrup Take 5 mLs by mouth 4 (four) times daily as needed. 07/17/17   Joni ReiningSmith, Ronald K, PA-C  butalbital-acetaminophen-caffeine Compo(FIORICET, ESGIC) [id number] MG tablet Take 1-2 tablets by mouth every 6 (six) hours as needed for headache. 06/07/18 06/07/19  Minna AntisPaduchowski, Kevin, MD  hydrochlorothiazide (HYDRODIURIL) 25 MG tablet Take 1 tablet (25 mg total) by mouth daily. 06/07/18   Minna AntisPaduchowski, Kevin, MD  loratadine (CLARITIN) 10 MG tablet Take 1 tablet (10 mg total) by mouth daily. 10/03/14 10/03/15  Tommi RumpsSummers, Rhonda L, PA-C  medroxyPROGESTERone (PROVERA) 10 MG tablet Take 2 tablets (20 mg total) by mouth daily. 09/12/16   Sharman CheekStafford, Phillip, MD  naproxen (NAPROSYN) 500 MG tablet Take 1 tablet (500 mg total) by mouth 2 (two) times daily with a meal. 01/02/19   Jene EveryKinner, Skyelyn Scruggs, MD     Allergies Patient  has no known allergies.  No family history on file.  Social History Social History   Tobacco Use  . Smoking status: Former Games developermoker  . Smokeless tobacco: Never Used  Substance Use Topics  . Alcohol use: No  . Drug use: No    Review of Systems  Constitutional: No fever/chills Eyes: No visual changes.  ENT: No neck pain Cardiovascular: Denies chest pain. Respiratory: Denies shortness of breath. Gastrointestinal: No abdominal pain.  No nausea, no vomiting.   Genitourinary: Negative for dysuria. Musculoskeletal: Negative for back pain. Skin: Negative for rash. Neurological: As above   ____________________________________________   PHYSICAL EXAM:  VITAL SIGNS: ED Triage Vitals  Enc Vitals Group     BP 01/02/19 2043 (!) 132/103     Pulse Rate 01/02/19 2043 93     Resp 01/02/19 2043 20     Temp 01/02/19 2043 99.2 F (37.3 C)     Temp Source 01/02/19 2043 Oral     SpO2 01/02/19 2043 98 %     Weight 01/02/19 2044 136.1 kg (300 lb)     Height 01/02/19 2044 1.6 m (5\' 3" )     Head Circumference --      Peak Flow --      Pain Score 01/02/19 2044 8     Pain Loc --      Pain Edu? --      Excl. in GC? --     Constitutional: Alert and oriented. No acute  distress. Eyes: Conjunctivae are normal.   Nose: No congestion/rhinnorhea. Mouth/Throat: Mucous membranes are moist.    Cardiovascular: Normal rate, regular rhythm. Grossly normal heart sounds.  Good peripheral circulation. Respiratory: Normal respiratory effort.  No retractions. Lungs CTAB. Gastrointestinal: Soft and nontender. No distention.  No CVA tenderness.  Musculoskeletal: No lower extremity tenderness nor edema.  Warm and well perfused Neurologic:  Normal speech and language. No gross focal neurologic deficits are appreciated.  Cranial nerves II through XII are normal Skin:  Skin is warm, dry and intact. No rash noted. Psychiatric: Mood and affect are normal. Speech and behavior are normal.   ____________________________________________   LABS (all labs ordered are listed, but only abnormal results are displayed)  Labs Reviewed - No data to display ____________________________________________  EKG  None ____________________________________________  RADIOLOGY  CT head unremarkable ____________________________________________   PROCEDURES  Procedure(s) performed: No  Procedures   Critical Care performed: No ____________________________________________   INITIAL IMPRESSION / ASSESSMENT AND PLAN / ED COURSE  Pertinent labs & imaging results that were available during my care of the patient were reviewed by me and considered in my medical decision making (see chart for details).  Patient presents with chronic headache, has never had any imaging.  Overall she is well-appearing and with reassuring exam.  Will obtain CT head, treat with IM Toradol and reevaluate.  Anticipate outpatient follow-up with neurology  CT head is reassuring, patient feeling better after Toradol    ____________________________________________   FINAL CLINICAL IMPRESSION(S) / ED DIAGNOSES  Final diagnoses:  Chronic nonintractable headache, unspecified headache type  Hypertension, unspecified type        Note:  This document was prepared using Dragon voice recognition software and may include unintentional dictation errors.   Lavonia Drafts, MD 01/02/19 2259

## 2019-01-04 ENCOUNTER — Emergency Department
Admission: EM | Admit: 2019-01-04 | Discharge: 2019-01-04 | Disposition: A | Discharge status: Home / Self Care | Payer: Medicaid Other | Attending: Emergency Medicine | Admitting: Emergency Medicine

## 2019-01-04 ENCOUNTER — Other Ambulatory Visit: Payer: Self-pay

## 2019-01-04 ENCOUNTER — Encounter: Payer: Self-pay | Admitting: Emergency Medicine

## 2019-01-04 ENCOUNTER — Emergency Department: Payer: Medicaid Other

## 2019-01-04 DIAGNOSIS — Z87891 Personal history of nicotine dependence: Secondary | ICD-10-CM | POA: Diagnosis not present

## 2019-01-04 DIAGNOSIS — I10 Essential (primary) hypertension: Secondary | ICD-10-CM | POA: Insufficient documentation

## 2019-01-04 DIAGNOSIS — Z79899 Other long term (current) drug therapy: Secondary | ICD-10-CM | POA: Insufficient documentation

## 2019-01-04 DIAGNOSIS — N939 Abnormal uterine and vaginal bleeding, unspecified: Secondary | ICD-10-CM | POA: Insufficient documentation

## 2019-01-04 LAB — COMPREHENSIVE METABOLIC PANEL
ALT: 26 U/L (ref 0–44)
AST: 20 U/L (ref 15–41)
Albumin: 4.5 g/dL (ref 3.5–5.0)
Alkaline Phosphatase: 73 U/L (ref 38–126)
Anion gap: 13 (ref 5–15)
BUN: 16 mg/dL (ref 6–20)
CO2: 21 mmol/L — ABNORMAL LOW (ref 22–32)
Calcium: 9.1 mg/dL (ref 8.9–10.3)
Chloride: 101 mmol/L (ref 98–111)
Creatinine, Ser: 0.8 mg/dL (ref 0.44–1.00)
GFR calc Af Amer: >60 mL/min (ref >60)
GFR calc non Af Amer: >60 mL/min (ref >60)
Glucose, Bld: 119 mg/dL — ABNORMAL HIGH (ref 70–99)
Potassium: 3.2 mmol/L — ABNORMAL LOW (ref 3.5–5.1)
Sodium: 135 mmol/L (ref 135–145)
Total Bilirubin: 0.9 mg/dL (ref 0.3–1.2)
Total Protein: 8.4 g/dL — ABNORMAL HIGH (ref 6.5–8.1)

## 2019-01-04 LAB — URINALYSIS, COMPLETE (UACMP) WITH MICROSCOPIC
Bacteria, UA: NONE SEEN
RBC / HPF: >50 RBC/hpf — ABNORMAL HIGH (ref 0–5)
Specific Gravity, Urine: 1.024 (ref 1.005–1.030)
Squamous Epithelial / HPF: NONE SEEN (ref 0–5)
WBC, UA: NONE SEEN WBC/hpf (ref 0–5)

## 2019-01-04 LAB — CBC
HCT: 42.2 % (ref 36.0–46.0)
Hemoglobin: 13.8 g/dL (ref 12.0–15.0)
MCH: 27.7 pg (ref 26.0–34.0)
MCHC: 32.7 g/dL (ref 30.0–36.0)
MCV: 84.7 fL (ref 80.0–100.0)
Platelets: 343 10*3/uL (ref 150–400)
RBC: 4.98 MIL/uL (ref 3.87–5.11)
RDW: 14 % (ref 11.5–15.5)
WBC: 8.6 10*3/uL (ref 4.0–10.5)
nRBC: 0 % (ref 0.0–0.2)

## 2019-01-04 LAB — LIPASE, BLOOD: Lipase: 29 U/L (ref 11–51)

## 2019-01-04 LAB — POCT PREGNANCY, URINE: Preg Test, Ur: NEGATIVE

## 2019-01-04 MED ORDER — MEDROXYPROGESTERONE ACETATE 10 MG PO TABS: 20.0000 mg | ORAL_TABLET | Freq: Every day | ORAL | 0 refills | Status: DC

## 2019-01-04 NOTE — ED Triage Notes (Signed)
Patient reports intermittent heavy vaginal bleeding x1 month. Reports abdominal pain. States she has seen lots of clots. Patient reports history of polyps.

## 2019-01-04 NOTE — ED Provider Notes (Signed)
[facility] Emergency Department Provider Note  ____________________________________________  Time seen: Approximately 7:59 PM  I have reviewed the triage vital signs and the nursing notes.   HISTORY  Chief Complaint Vaginal Bleeding and Abdominal Pain    HPI Amber Burnett is a 33 y.o. female who presents the emergency department complaining of pelvic and back pain with accompanying abnormal vaginal bleeding.  Patient reports that she has had increasing vaginal bleeding x1 month.  Patient reports that she is bleeding so heavy that she has to use multiple pads to contain bleeding at any time.  Patient reports that bleeding began intermittently at the start of the month but has increased to the point that she is bleeding heavily every day.  Patient does have a history of uterine polyps that were surgically removed.  Since then patient has not had difficulty with pain or abnormal bleeding.  Patient denies any nausea, vomiting, diarrhea or constipation.  No dysuria, polyuria, hematuria.         Past Medical History:  Diagnosis Date  . HTN (hypertension)     There are no active problems to display for this patient.   History reviewed. No pertinent surgical history.  Prior to Admission medications   Medication Sig Start Date End Date Taking? Authorizing Provider  amLODipine (NORVASC) 5 MG tablet Take 1 tablet (5 mg total) by mouth daily. 01/02/19 03/03/19  Jene EveryKinner, Robert, MD  atenolol (TENORMIN) 25 MG tablet Take by mouth daily.    [provider]  brompheniramine-pseudoephedrine-DM 30-2-10 MG/5ML syrup Take 5 mLs by mouth 4 (four) times daily as needed. 07/17/17   Joni ReiningSmith, Ronald K, PA-C  butalbital-acetaminophen-caffeine [place](FIORICET, ESGIC) [phone] MG tablet Take 1-2 tablets by mouth every 6 (six) hours as needed for headache. 06/07/18 06/07/19  Minna AntisPaduchowski, Kevin, MD  hydrochlorothiazide (HYDRODIURIL) 25 MG tablet Take 1 tablet (25 mg total) by mouth  daily. 06/07/18   Minna AntisPaduchowski, Kevin, MD  loratadine (CLARITIN) 10 MG tablet Take 1 tablet (10 mg total) by mouth daily. 10/03/14 10/03/15  Tommi RumpsSummers, Rhonda L, PA-C  medroxyPROGESTERone (PROVERA) 10 MG tablet Take 2 tablets (20 mg total) by mouth daily for 10 days. 01/04/19 01/14/19  Cuthriell, Delorise RoyalsJonathan D, PA-C  naproxen (NAPROSYN) 500 MG tablet Take 1 tablet (500 mg total) by mouth 2 (two) times daily with a meal. 01/02/19   Jene EveryKinner, Robert, MD    Allergies Patient has no known allergies.  No family history on file.  Social History Social History   Tobacco Use  . Smoking status: Former Games developermoker  . Smokeless tobacco: Never Used  Substance Use Topics  . Alcohol use: No  . Drug use: No     Review of Systems  Constitutional: No fever/chills Eyes: No visual changes. No discharge ENT: No upper respiratory complaints. Cardiovascular: no chest pain. Respiratory: no cough. No SOB. Gastrointestinal: No abdominal pain.  No nausea, no vomiting.  No diarrhea.  No constipation. Genitourinary: Negative for dysuria. No hematuria.  Positive for pelvic and low back pain with abnormal vaginal bleeding x1 month Musculoskeletal: Negative for musculoskeletal pain. Skin: Negative for rash, abrasions, lacerations, ecchymosis. Neurological: Negative for headaches, focal weakness or numbness. 10-point ROS otherwise negative.  ____________________________________________   PHYSICAL EXAM:  VITAL SIGNS: ED Triage Vitals  Enc Vitals Group     BP 01/04/19 1546 (!) 140/96     Pulse Rate 01/04/19 1546 (!) 113     Resp 01/04/19 1546 16     Temp 01/04/19 1546 98.6 F (37 C)  Temp Source 01/04/19 1546 Oral     SpO2 01/04/19 1546 100 %     Weight 01/04/19 1556 299 lb 13.2 oz (136 kg)     Height 01/04/19 1556 5\' 3"  (1.6 m)     Head Circumference --      Peak Flow --      Pain Score 01/04/19 1556 9     Pain Loc --      Pain Edu? --      Excl. in [place]? --      Constitutional: Alert and oriented. Well  appearing and in no acute distress. Eyes: Conjunctivae are normal. PERRL. EOMI. Head: Atraumatic. ENT:      Ears:       Nose: No congestion/rhinnorhea.      Mouth/Throat: Mucous membranes are moist.  Neck: No stridor.    Cardiovascular: Normal rate, regular rhythm. Normal S1 and S2.  Good peripheral circulation. Respiratory: Normal respiratory effort without tachypnea or retractions. Lungs CTAB. Good air entry to the bases with no decreased or absent breath sounds. Gastrointestinal: Bowel sounds 4 quadrants. Soft and nontender to palpation. No guarding or rigidity. No palpable masses. No distention. No CVA tenderness. Musculoskeletal: Full range of motion to all extremities. No gross deformities appreciated. Neurologic:  Normal speech and language. No gross focal neurologic deficits are appreciated.  Skin:  Skin is warm, dry and intact. No rash noted. Psychiatric: Mood and affect are normal. Speech and behavior are normal. Patient exhibits appropriate insight and judgement.   ____________________________________________   LABS (all labs ordered are listed, but only abnormal results are displayed)  Labs Reviewed  COMPREHENSIVE METABOLIC PANEL - Abnormal; Notable for the following components:      Result Value   Potassium 3.2 (*)    CO2 21 (*)    Glucose, Bld 119 (*)    Total Protein 8.4 (*)    All other components within normal limits  URINALYSIS, COMPLETE (UACMP) WITH MICROSCOPIC - Abnormal; Notable for the following components:   Color, Urine RED (*)    APPearance HAZY (*)    Glucose, UA   (*)    Value: TEST NOT REPORTED DUE TO COLOR INTERFERENCE OF URINE PIGMENT   Hgb urine dipstick   (*)    Value: TEST NOT REPORTED DUE TO COLOR INTERFERENCE OF URINE PIGMENT   Bilirubin Urine   (*)    Value: TEST NOT REPORTED DUE TO COLOR INTERFERENCE OF URINE PIGMENT   Ketones, ur   (*)    Value: TEST NOT REPORTED DUE TO COLOR INTERFERENCE OF URINE PIGMENT   Protein, ur   (*)    Value:  TEST NOT REPORTED DUE TO COLOR INTERFERENCE OF URINE PIGMENT   Nitrite   (*)    Value: TEST NOT REPORTED DUE TO COLOR INTERFERENCE OF URINE PIGMENT   Leukocytes,Ua   (*)    Value: TEST NOT REPORTED DUE TO COLOR INTERFERENCE OF URINE PIGMENT   RBC / HPF >50 (*)    All other components within normal limits  LIPASE, BLOOD  CBC  POCT PREGNANCY, URINE  POC URINE PREG, ED   ____________________________________________  EKG   ____________________________________________  RADIOLOGY I personally viewed and evaluated these images as part of my medical decision making, as well as reviewing the written report by the radiologist.  US Pelvic Complete With Transvaginal  Result Date: 01/04/2019 CLINICAL DATA:  33 year old female with vaginal bleeding and clots x1 month. History of polyps. Prior polypectomy. LMP: 01/02/2019 EXAM: TRANSABDOMINAL AND TRANSVAGINAL ULTRASOUND  OF PELVIS TECHNIQUE: Both transabdominal and transvaginal ultrasound examinations of the pelvis were performed. Transabdominal technique was performed for global imaging of the pelvis including uterus, ovaries, adnexal regions, and pelvic cul-de-sac. It was necessary to proceed with endovaginal exam following the transabdominal exam to visualize the endometrium and ovaries. COMPARISON:  Pregnancy ultrasound dated 08/21/2007 FINDINGS: Uterus Measurements: 7.7 x 3.8 x 3.4 cm = volume: 52 mL. The uterus is anteverted appears unremarkable. Endometrium Thickness: 9 mm. There are areas of vascularity in the upper endometrium. A vascular pedicle may be present in the mid to upper endometrium (image 56/81). Further evaluation with saline infused hysterosonography or hysteroscopy on a nonemergent basis is recommended. Right ovary Measurements: 3.1 x 2.7 x 2.2 cm = volume: 10 mL. Normal appearance/no adnexal mass. Left ovary Measurements: 2.8 x 2.6 x 2.4 cm = volume: 9 mL. Normal appearance/no adnexal mass. Other findings No abnormal free fluid.  IMPRESSION: Areas of vascularity the upper endometrium as described. An occult endometrial polyp is not excluded. Further evaluation with saline infused hysterosonography or hysteroscopy is recommended. Electronically Signed   By: Elgie CollardArash  Radparvar M.D.   On: 01/04/2019 20:57    ____________________________________________    PROCEDURES  Procedure(s) performed:    Procedures    Medications - No data to display   ____________________________________________   INITIAL IMPRESSION / ASSESSMENT AND PLAN / ED COURSE  Pertinent labs & imaging results that were available during my care of the patient were reviewed by me and considered in my medical decision making (see chart for details).  Review of the [place] CSRS was performed in accordance of the [facility] prior to dispensing any controlled drugs.  Clinical Course as of Jan 03 2122  Fri Jan 04, 2019  2007 Patient presented to the emergency department complaining of abnormal vaginal bleeding x1 month.  This began with intermittent bleeding but has now increased to heavy bleeding every day.  Patient has a history of uterine polyps that were surgically removed by her OB/GYN.  She has had no complications until developing abnormal bleeding over the last month.  Patient has associated pelvic and low back pain.  No dysuria, polyuria, hematuria.  No vaginal discharge.  No medications for his complaint prior to arrival.   [JC]  2007 Differential included pregnancy, miscarriage, endometriosis, abnormal vaginal bleeding, UTI, nephrolithiasis.   [JC]    Clinical Course User Index [JC] Cuthriell, Delorise RoyalsJonathan D, PA-C          Patient's diagnosis is consistent with abnormal uterine bleeding.  Patient presented to the emergency department complaining of increasing vaginal bleeding x1 month.  Patient is having pelvic and low back pain.  Labs are reassuring.  Exam is reassuring.  She does have a history of uterine polyps that was surgically excised in the  past.  Ultrasound reveals increased vasculature and possible polyp.  At this time, patient will be placed on Provera tablets and instructed to follow-up with OB/GYN.  If patient has worsening pain, worsening bleeding, any weakness, dizziness or near syncopal feeling patient should return to the emergency department prior to seeing her OB/GYN..  Patient is given ED precautions to return to the ED for any worsening or new symptoms.     ____________________________________________  FINAL CLINICAL IMPRESSION(S) / ED DIAGNOSES  Final diagnoses:  Abnormal uterine bleeding      NEW MEDICATIONS STARTED DURING THIS VISIT:  ED Discharge Orders         Ordered    medroxyPROGESTERone (PROVERA) 10 MG tablet  Daily  01/04/19 2121              This chart was dictated using voice recognition software/Dragon. Despite best efforts to proofread, errors can occur which can change the meaning. Any change was purely unintentional.    Racheal Patches, PA-C 01/04/19 2123    Emily Filbert, MD 01/04/19 2258

## 2019-01-04 NOTE — ED Notes (Signed)
Pt alert/calm. Roderic Palau PA at bedside.

## 2019-01-04 NOTE — ED Notes (Signed)
Pt reports history of polyps; states irregular periods; back pain accompanies heavy bleeding; states uses 3 pads at a time; also c/o HA.

## 2019-01-11 ENCOUNTER — Telehealth: Payer: Self-pay | Admitting: Obstetrics and Gynecology

## 2019-01-11 ENCOUNTER — Encounter: Payer: Self-pay | Admitting: Obstetrics and Gynecology

## 2019-01-11 ENCOUNTER — Other Ambulatory Visit: Payer: Self-pay

## 2019-01-11 ENCOUNTER — Other Ambulatory Visit (HOSPITAL_COMMUNITY)
Admission: RE | Admit: 2019-01-11 | Discharge: 2019-01-11 | Disposition: A | Discharge status: Home / Self Care | Payer: Medicaid Other | Source: Ambulatory Visit | Attending: Obstetrics and Gynecology | Admitting: Obstetrics and Gynecology

## 2019-01-11 ENCOUNTER — Ambulatory Visit (INDEPENDENT_AMBULATORY_CARE_PROVIDER_SITE_OTHER): Payer: Medicaid Other | Admitting: Obstetrics and Gynecology

## 2019-01-11 VITALS — BP 130/82 | HR 83 | Ht 63.0 in | Wt 300.0 lb

## 2019-01-11 DIAGNOSIS — Z113 Encounter for screening for infections with a predominantly sexual mode of transmission: Secondary | ICD-10-CM | POA: Diagnosis not present

## 2019-01-11 DIAGNOSIS — Z124 Encounter for screening for malignant neoplasm of cervix: Secondary | ICD-10-CM | POA: Insufficient documentation

## 2019-01-11 DIAGNOSIS — N840 Polyp of corpus uteri: Secondary | ICD-10-CM | POA: Diagnosis not present

## 2019-01-11 DIAGNOSIS — N926 Irregular menstruation, unspecified: Secondary | ICD-10-CM | POA: Diagnosis not present

## 2019-01-11 MED ORDER — NORETHINDRONE 0.35 MG PO TABS: 1.0000 | ORAL_TABLET | Freq: Every day | ORAL | 11 refills | Status: DC

## 2019-01-11 NOTE — Telephone Encounter (Signed)
-----   Message from Homero Fellers, MD sent at 01/11/2019  9:02 AM EDT ----- Surgery Booking Request Patient Full Name:  Amber Burnett  MRN: [id number]  DOB: 11-05-1985  Surgeon: Homero Fellers, MD  Requested Surgery Date and Time: next 30 days Primary Diagnosis AND Code: abnormal uterine bleeding N93. 9  Secondary Diagnosis and Code: endometrial polyp N84. 0  Surgical Procedure: hysteroscopy, dilation and curretage, possible polypectomy L&D Notification: No Admission Status: same day surgery Length of Surgery: 1 hour Special Case Needs: none H&P: 01/11/2019 (date) Phone Interview???: yes Interpreter: Language:  Medical Clearance: no Special Scheduling Instructions: none Acuity: P3

## 2019-01-11 NOTE — Telephone Encounter (Signed)
Lmtrc

## 2019-01-11 NOTE — Progress Notes (Signed)
Patient ID: Amber Burnett, female   DOB: 01/24/1986, 33 y.o.   MRN: [id number]  Reason for Consult: Follow-up (ER follow up, poylp was found in ER because of very heavy bleeding )   [facility]  Subjective:     HPI:  Amber Burnett is a 33 y.o. female . She presents today for an ER follow up. She was seen in the OR for heavy uterine bleeding which she reported had been going on for a month. She as wearing 3 pads a time. She was getting headaches from the blood lots. She was passing large clots. She had similar bleeding in her 1520s from endometrial polyps. She had a transvaginal US which showed an endometrial polyp. She   Gynecological History Menarche: 15 Menopause: not applicable Describes periods as irregular since menarche. Occurring every 2-3 months. Usually heavy bleeding for 1 month at the time of her period.  Last pap smear: unknown Last Mammogram: never History of STDs: Trichomonas as a teen. Sexually Active: yes Not currently using birth control  Obstetrical History 2009- SVD, female, complicated by preeclampsia  Past Medical History:  Diagnosis Date  . HTN (hypertension)    History reviewed. No pertinent family history. Past Surgical History:  Procedure Laterality Date  . TONSILLECTOMY AND ADENOIDECTOMY      Short Social History:  Social History   Tobacco Use  . Smoking status: Former Games developermoker  . Smokeless tobacco: Never Used  Substance Use Topics  . Alcohol use: No    No Known Allergies  Current Outpatient Medications  Medication Sig Dispense Refill  . butalbital-acetaminophen-caffeine (FIORICET, ESGIC) 50-325-40 MG tablet Take 1-2 tablets by mouth every 6 (six) hours as needed for headache. 10 tablet 0  . hydrochlorothiazide (HYDRODIURIL) 25 MG tablet Take 1 tablet (25 mg total) by mouth daily. 30 tablet 2  . medroxyPROGESTERone (PROVERA) 10 MG tablet Take 2 tablets (20 mg total) by mouth daily for 10 days. 20 tablet 0   . loratadine (CLARITIN) 10 MG tablet Take 1 tablet (10 mg total) by mouth daily. 30 tablet 2   No current facility-administered medications for this visit.     Review of Systems  Constitutional: Negative for chills, fatigue, fever and unexpected weight change.  HENT: Negative for trouble swallowing.  Eyes: Negative for loss of vision.  Respiratory: Negative for cough, shortness of breath and wheezing.  Cardiovascular: Negative for chest pain, leg swelling, palpitations and syncope.  GI: Negative for abdominal pain, blood in stool, diarrhea, nausea and vomiting.  GU: Negative for difficulty urinating, dysuria, frequency and hematuria.  Musculoskeletal: Negative for back pain, leg pain and joint pain.  Skin: Negative for rash.  Neurological: Negative for dizziness, headaches, light-headedness, numbness and seizures.  Psychiatric: Negative for behavioral problem, confusion, depressed mood and sleep disturbance.        Objective:  Objective   Vitals:   01/11/19 0805  BP: 130/82  Pulse: 83  Weight: 300 lb (136.1 kg)  Height: 5\' 3"  (1.6 m)   Body mass index is 53.14 kg/m.  Physical Exam Vitals signs and nursing note reviewed.  Constitutional:      Appearance: She is well-developed.  HENT:     Head: Normocephalic and atraumatic.  Eyes:     Pupils: Pupils are equal, round, and reactive to light.  Cardiovascular:     Rate and Rhythm: Normal rate and regular rhythm.  Pulmonary:     Effort: Pulmonary effort is normal. No respiratory distress.  Abdominal:  General: Abdomen is flat.     Palpations: Abdomen is soft. There is no mass.     Tenderness: There is no rebound.     Hernia: No hernia is present.  Genitourinary:    Comments: External: Normal appearing vulva. No lesions noted.  Speculum examination: Normal appearing cervix. Scant bloody mucus in the vaginal vault. No discharge.   Bimanual examination: Uterus midline, non-tender, normal in size, shape and contour.  No  CMT. No adnexal masses. No adnexal tenderness. Pelvis not fixed.    Skin:    General: Skin is warm and dry.  Neurological:     Mental Status: She is alert and oriented to person, place, and time.     Sensory: Sensory deficit:   Psychiatric:        Behavior: Behavior normal.        Thought Content: Thought content normal.        Judgment: Judgment normal.        Assessment/Plan:    33 yo with endometrial polyp, irregular bleeding  1) AUB and endometrial polyp- Discussed options for further diagnositc testing or hysteroscopy D&C. Patient would like to proceed with hysteroscopy D&C. She was counseled regarding the possible complications and risks of this procedure as well as alternatives.  Consents signed. Discussed preoperative covid testing.  2) Pap smear- updated today 3) STD screening offered and accepted. Labs and vaginal swab sent.   More than 40 minutes were spent face to face with the patient in the room with more than 50% of the time spent providing counseling and discussing the plan of management.    Adrian Prows MD [facility] 01/11/2019 8:46 AM

## 2019-01-11 NOTE — H&P (View-Only) (Signed)
 Patient ID: Amber Burnett, female   DOB: 04/24/1986, 33 y.o.   MRN: [id number]  Reason for Consult: Follow-up (ER follow up, poylp was found in ER because of very heavy bleeding )   Referred by [facility]*  Subjective:     HPI:  Amber Burnett is a 33 y.o. female . She presents today for an ER follow up. She was seen in the OR for heavy uterine bleeding which she reported had been going on for a month. She as wearing 3 pads a time. She was getting headaches from the blood lots. She was passing large clots. She had similar bleeding in her 20s from endometrial polyps. She had a transvaginal US which showed an endometrial polyp. She   Gynecological History Menarche: 15 Menopause: not applicable Describes periods as irregular since menarche. Occurring every 2-3 months. Usually heavy bleeding for 1 month at the time of her period.  Last pap smear: unknown Last Mammogram: never History of STDs: Trichomonas as a teen. Sexually Active: yes Not currently using birth control  Obstetrical History 2009- SVD, female, complicated by preeclampsia  Past Medical History:  Diagnosis Date  . HTN (hypertension)    History reviewed. No pertinent family history. Past Surgical History:  Procedure Laterality Date  . TONSILLECTOMY AND ADENOIDECTOMY      Short Social History:  Social History   Tobacco Use  . Smoking status: Former Smoker  . Smokeless tobacco: Never Used  Substance Use Topics  . Alcohol use: No    No Known Allergies  Current Outpatient Medications  Medication Sig Dispense Refill  . butalbital-acetaminophen-caffeine (FIORICET, ESGIC) 50-325-40 MG tablet Take 1-2 tablets by mouth every 6 (six) hours as needed for headache. 10 tablet 0  . hydrochlorothiazide (HYDRODIURIL) 25 MG tablet Take 1 tablet (25 mg total) by mouth daily. 30 tablet 2  . medroxyPROGESTERone (PROVERA) 10 MG tablet Take 2 tablets (20 mg total) by mouth daily for 10 days. 20 tablet 0   . loratadine (CLARITIN) 10 MG tablet Take 1 tablet (10 mg total) by mouth daily. 30 tablet 2   No current facility-administered medications for this visit.     Review of Systems  Constitutional: Negative for chills, fatigue, fever and unexpected weight change.  HENT: Negative for trouble swallowing.  Eyes: Negative for loss of vision.  Respiratory: Negative for cough, shortness of breath and wheezing.  Cardiovascular: Negative for chest pain, leg swelling, palpitations and syncope.  GI: Negative for abdominal pain, blood in stool, diarrhea, nausea and vomiting.  GU: Negative for difficulty urinating, dysuria, frequency and hematuria.  Musculoskeletal: Negative for back pain, leg pain and joint pain.  Skin: Negative for rash.  Neurological: Negative for dizziness, headaches, light-headedness, numbness and seizures.  Psychiatric: Negative for behavioral problem, confusion, depressed mood and sleep disturbance.        Objective:  Objective   Vitals:   01/11/19 0805  BP: 130/82  Pulse: 83  Weight: 300 lb (136.1 kg)  Height: 5' 3" (1.6 m)   Body mass index is 53.14 kg/m.  Physical Exam Vitals signs and nursing note reviewed.  Constitutional:      Appearance: She is well-developed.  HENT:     Head: Normocephalic and atraumatic.  Eyes:     Pupils: Pupils are equal, round, and reactive to light.  Cardiovascular:     Rate and Rhythm: Normal rate and regular rhythm.  Pulmonary:     Effort: Pulmonary effort is normal. No respiratory distress.  Abdominal:       General: Abdomen is flat.     Palpations: Abdomen is soft. There is no mass.     Tenderness: There is no rebound.     Hernia: No hernia is present.  Genitourinary:    Comments: External: Normal appearing vulva. No lesions noted.  Speculum examination: Normal appearing cervix. Scant bloody mucus in the vaginal vault. No discharge.   Bimanual examination: Uterus midline, non-tender, normal in size, shape and contour.  No  CMT. No adnexal masses. No adnexal tenderness. Pelvis not fixed.    Skin:    General: Skin is warm and dry.  Neurological:     Mental Status: She is alert and oriented to person, place, and time.     Sensory: Sensory deficit:   Psychiatric:        Behavior: Behavior normal.        Thought Content: Thought content normal.        Judgment: Judgment normal.        Assessment/Plan:    33 yo with endometrial polyp, irregular bleeding  1) AUB and endometrial polyp- Discussed options for further diagnositc testing or hysteroscopy D&C. Patient would like to proceed with hysteroscopy D&C. She was counseled regarding the possible complications and risks of this procedure as well as alternatives.  Consents signed. Discussed preoperative covid testing.  2) Pap smear- updated today 3) STD screening offered and accepted. Labs and vaginal swab sent.   More than 40 minutes were spent face to face with the patient in the room with more than 50% of the time spent providing counseling and discussing the plan of management.    Adrian Prows MD [facility] 01/11/2019 8:46 AM

## 2019-01-15 NOTE — Telephone Encounter (Signed)
Lmtrc

## 2019-01-16 LAB — CYTOLOGY - PAP
Chlamydia: NEGATIVE
Diagnosis: NEGATIVE
HPV: NOT DETECTED
Neisseria Gonorrhea: NEGATIVE
Trichomonas: NEGATIVE

## 2019-01-16 LAB — RPR: RPR Ser Ql: NONREACTIVE

## 2019-01-16 LAB — HEPATITIS PANEL, ACUTE
Hep A IgM: NEGATIVE
Hep B C IgM: NEGATIVE
Hep C Virus Ab: 0.1 s/co ratio (ref 0.0–0.9)
Hepatitis B Surface Ag: NEGATIVE

## 2019-01-16 LAB — HSV(HERPES SMPLX)ABS-I+II(IGG+IGM)-BLD
HSV 1 Glycoprotein G Ab, IgG: 31.2 index — ABNORMAL HIGH (ref 0.00–0.90)
HSV 2 IgG, Type Spec: 17.9 index — ABNORMAL HIGH (ref 0.00–0.90)
HSVI/II Comb IgM: 2.02 Ratio — ABNORMAL HIGH (ref 0.00–0.90)

## 2019-01-16 LAB — HIV ANTIBODY (ROUTINE TESTING W REFLEX): HIV Screen 4th Generation wRfx: NONREACTIVE

## 2019-01-16 NOTE — Telephone Encounter (Signed)
Patient is aware of Pre-admit Testing phone interview to be scheduled, Covid testing on 01/31/19 @ 8-10:30am at [facility] (directions given), and OR on 01/31/19. Patient is aware she will be asked to quarantine after Covid testing. Patient is aware she may receive calls from the [place] and [facility].

## 2019-01-17 ENCOUNTER — Telehealth: Payer: Self-pay | Admitting: Obstetrics and Gynecology

## 2019-01-17 NOTE — Telephone Encounter (Signed)
Patient called to reschedule surgery for 02/05/19. Patient is aware covid testing will be 02/01/19.

## 2019-01-18 ENCOUNTER — Telehealth: Payer: Self-pay | Admitting: Obstetrics and Gynecology

## 2019-01-18 NOTE — Telephone Encounter (Signed)
Patient is aware of Pre-admit testing phone interview on 01/29/19.

## 2019-01-24 ENCOUNTER — Other Ambulatory Visit: Payer: Self-pay | Admitting: Obstetrics and Gynecology

## 2019-01-24 DIAGNOSIS — A6009 Herpesviral infection of other urogenital tract: Secondary | ICD-10-CM

## 2019-01-24 MED ORDER — VALACYCLOVIR HCL 1 G PO TABS: 1000.0000 mg | ORAL_TABLET | Freq: Two times a day (BID) | ORAL | 0 refills | Status: AC

## 2019-01-24 NOTE — Progress Notes (Signed)
Released to mychart with a note

## 2019-01-29 ENCOUNTER — Other Ambulatory Visit: Payer: Self-pay

## 2019-01-29 ENCOUNTER — Encounter
Admission: RE | Admit: 2019-01-29 | Discharge: 2019-01-29 | Disposition: A | Discharge status: Home / Self Care | Payer: Medicaid Other | Source: Ambulatory Visit | Attending: Obstetrics and Gynecology | Admitting: Obstetrics and Gynecology

## 2019-01-29 NOTE — Patient Instructions (Signed)
Your procedure is scheduled on: Tuesday 02/05/19 Report to [place]. To find out your arrival time please call [phone] between 1PM - 3PM on Monday 02/04/19.  Remember: Instructions that are not followed completely may result in serious medical risk, up to and including death, or upon the discretion of your surgeon and anesthesiologist your surgery may need to be rescheduled.     _X__ 1. Do not eat food after midnight the night before your procedure.                 No gum chewing or hard candies. You may drink clear liquids up to 2 hours                 before you are scheduled to arrive for your surgery- DO not drink clear                 liquids within 2 hours of the start of your surgery.                 Clear Liquids include:  water, apple juice without pulp, clear carbohydrate                 drink such as Clearfast or Gatorade, Black Coffee or Tea (Do not add                 anything to coffee or tea). Diabetics water only  __X__2.  On the morning of surgery brush your teeth with toothpaste and water, you                 may rinse your mouth with mouthwash if you wish.  Do not swallow any              toothpaste of mouthwash.     _X__ 3.  No Alcohol for 24 hours before or after surgery.   _X__ 4.  Do Not Smoke or use e-cigarettes For 24 Hours Prior to Your Surgery.                 Do not use any chewable tobacco products for at least 6 hours prior to                 surgery.  ____  5.  Bring all medications with you on the day of surgery if instructed.   __X__  6.  Notify your doctor if there is any change in your medical condition      (cold, fever, infections).     Do not wear jewelry, make-up, hairpins, clips or nail polish. Do not wear lotions, powders, or perfumes.  Do not shave 48 hours prior to surgery. Men may shave face and neck. Do not bring valuables to the hospital.    [facility] is not responsible for  any belongings or valuables.  Contacts, dentures/partials or body piercings may not be worn into surgery. Bring a case for your contacts, glasses or hearing aids, a denture cup will be supplied. Leave your suitcase in the car. After surgery it may be brought to your room. For patients admitted to the hospital, discharge time is determined by your treatment team.   Patients discharged the day of surgery will not be allowed to drive home.   Please read over the following fact sheets that you were given:   MRSA Information  __X__ Take these medicines the morning of surgery with A SIP OF WATER:  1. none  2.   3.   4.  5.  6.  ____ Fleet Enema (as directed)   ____ Use CHG Soap/SAGE wipes as directed  ____ Use inhalers on the day of surgery  ____ Stop metformin/Janumet/Farxiga 2 days prior to surgery    ____ Take 1/2 of usual insulin dose the night before surgery. No insulin the morning          of surgery.   ____ Stop Blood Thinners Coumadin/Plavix/Xarelto/Pleta/Pradaxa/Eliquis/Effient/Aspirin  on   Or contact your Surgeon, Cardiologist or Medical Doctor regarding  ability to stop your blood thinners  __X__ Stop Anti-inflammatories 7 days before surgery such as Advil, Ibuprofen, Motrin,  BC or Goodies Powder, Naprosyn, Naproxen, Aleve, Aspirin    __X__ Stop all herbal supplements, fish oil or vitamin E until after surgery.    ____ Bring C-Pap to the hospital.

## 2019-01-30 ENCOUNTER — Ambulatory Visit (INDEPENDENT_AMBULATORY_CARE_PROVIDER_SITE_OTHER): Payer: Medicaid Other | Admitting: Obstetrics and Gynecology

## 2019-01-30 ENCOUNTER — Encounter: Payer: Self-pay | Admitting: Obstetrics and Gynecology

## 2019-01-30 VITALS — BP 120/82 | HR 90 | Ht 63.0 in | Wt 302.0 lb

## 2019-01-30 DIAGNOSIS — A6009 Herpesviral infection of other urogenital tract: Secondary | ICD-10-CM | POA: Diagnosis not present

## 2019-01-30 NOTE — Progress Notes (Signed)
Patient ID: Amber Burnett, female   DOB: 09-12-1985, 33 y.o.   MRN: [id number]  Reason for Consult: Follow-up   Referred by [facility]*  Subjective:     HPI:  Amber Burnett is a 33 y.o. female. 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 She was treated with Valtrex and her symptoms resolved. She would like to know if there is a way to determine when she was infected.   Past Medical History:  Diagnosis Date  . HTN (hypertension)    History reviewed. No pertinent family history. Past Surgical History:  Procedure Laterality Date  . DILATION AND CURETTAGE OF UTERUS    . TONSILLECTOMY AND ADENOIDECTOMY      Short Social History:  Social History   Tobacco Use  . Smoking status: Former Games developer  . Smokeless tobacco: Never Used  Substance Use Topics  . Alcohol use: No    No Known Allergies  Current Outpatient Medications  Medication Sig Dispense Refill  . hydrochlorothiazide (HYDRODIURIL) 25 MG tablet Take 1 tablet (25 mg total) by mouth daily. 30 tablet 2  . norethindrone (MICRONOR) 0.35 MG tablet Take 1 tablet (0.35 mg total) by mouth daily. 1 Package 11  . valACYclovir (VALTREX) 1000 MG tablet Take 1 tablet (1,000 mg total) by mouth 2 (two) times daily for 10 days. (Patient taking differently: Take 1,000 mg by mouth 2 (two) times daily as needed. ) 20 tablet 0   No current facility-administered medications for this visit.     Review of Systems  Constitutional: Negative for chills, fatigue, fever and unexpected weight change.  HENT: Negative for trouble swallowing.  Eyes: Negative for loss of vision.  Respiratory: Negative for cough, shortness of breath and wheezing.  Cardiovascular: Negative for chest pain, leg swelling, palpitations and syncope.  GI: Negative for abdominal pain, blood in stool, diarrhea, nausea and vomiting.  GU: Negative for difficulty urinating, dysuria, frequency and hematuria.  Musculoskeletal: Negative for back pain, leg pain  and joint pain.  Skin: Negative for rash.  Neurological: Negative for dizziness, headaches, light-headedness, numbness and seizures.  Psychiatric: Negative for behavioral problem, confusion, depressed mood and sleep disturbance.        Objective:  Objective   Vitals:   01/30/19 0937  BP: 120/82  Pulse: 90  Weight: (!) 302 lb (137 kg)  Height: 5\' 3"  (1.6 m)   Body mass index is 53.5 kg/m.  Physical Exam Vitals signs and nursing note reviewed.  Constitutional:      Appearance: She is well-developed.  HENT:     Head: Normocephalic and atraumatic.  Eyes:     Pupils: Pupils are equal, round, and reactive to light.  Cardiovascular:     Rate and Rhythm: Normal rate and regular rhythm.  Pulmonary:     Effort: Pulmonary effort is normal. No respiratory distress.  Skin:    General: Skin is warm and dry.  Neurological:     Mental Status: She is alert and oriented to person, place, and time.  Psychiatric:        Behavior: Behavior normal.        Thought Content: Thought content normal.        Judgment: Judgment normal.        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 Discussed that there is not a way to know when she was infected. It may of been in the last several months but it  is impossible to say for sure. Also it is possible that her partner could of had this for a long time and she was only recently infected. Discussed episodic treatment vs continuous suppression. She would like to be on continuous suppression therapy at this time. Will recheck liver enzymes in 6-12 months.   More than 15 minutes were spent face to face with the patient in the room with more than 50% of the time spent providing counseling and discussing the plan of management.   Adrian Prows MD [facility], [facility] 01/30/2019 10:22 AM

## 2019-02-01 ENCOUNTER — Other Ambulatory Visit: Payer: Medicaid Other

## 2019-02-01 ENCOUNTER — Other Ambulatory Visit: Payer: Self-pay

## 2019-02-01 ENCOUNTER — Encounter
Admission: RE | Admit: 2019-02-01 | Discharge: 2019-02-01 | Disposition: A | Discharge status: Home / Self Care | Payer: Medicaid Other | Source: Ambulatory Visit | Attending: Obstetrics and Gynecology | Admitting: Obstetrics and Gynecology

## 2019-02-01 ENCOUNTER — Other Ambulatory Visit: Admission: RE | Admit: 2019-02-01 | Payer: Medicaid Other | Source: Ambulatory Visit

## 2019-02-01 DIAGNOSIS — N939 Abnormal uterine and vaginal bleeding, unspecified: Secondary | ICD-10-CM | POA: Diagnosis not present

## 2019-02-01 DIAGNOSIS — Z01812 Encounter for preprocedural laboratory examination: Secondary | ICD-10-CM | POA: Diagnosis not present

## 2019-02-01 DIAGNOSIS — Z0184 Encounter for antibody response examination: Secondary | ICD-10-CM | POA: Insufficient documentation

## 2019-02-01 DIAGNOSIS — Z20828 Contact with and (suspected) exposure to other viral communicable diseases: Secondary | ICD-10-CM | POA: Insufficient documentation

## 2019-02-01 DIAGNOSIS — N840 Polyp of corpus uteri: Secondary | ICD-10-CM | POA: Diagnosis not present

## 2019-02-01 DIAGNOSIS — I10 Essential (primary) hypertension: Secondary | ICD-10-CM | POA: Insufficient documentation

## 2019-02-01 LAB — BASIC METABOLIC PANEL
Anion gap: 10 (ref 5–15)
BUN: 14 mg/dL (ref 6–20)
CO2: 25 mmol/L (ref 22–32)
Calcium: 8.9 mg/dL (ref 8.9–10.3)
Chloride: 106 mmol/L (ref 98–111)
Creatinine, Ser: 0.85 mg/dL (ref 0.44–1.00)
GFR calc Af Amer: >60 mL/min (ref >60)
GFR calc non Af Amer: >60 mL/min (ref >60)
Glucose, Bld: 116 mg/dL — ABNORMAL HIGH (ref 70–99)
Potassium: 3.2 mmol/L — ABNORMAL LOW (ref 3.5–5.1)
Sodium: 141 mmol/L (ref 135–145)

## 2019-02-01 LAB — TYPE AND SCREEN
ABO/RH(D): O POS
Antibody Screen: NEGATIVE

## 2019-02-01 LAB — CBC
HCT: 37.1 % (ref 36.0–46.0)
Hemoglobin: 12 g/dL (ref 12.0–15.0)
MCH: 27.9 pg (ref 26.0–34.0)
MCHC: 32.3 g/dL (ref 30.0–36.0)
MCV: 86.3 fL (ref 80.0–100.0)
Platelets: 308 10*3/uL (ref 150–400)
RBC: 4.3 MIL/uL (ref 3.87–5.11)
RDW: 14.1 % (ref 11.5–15.5)
WBC: 6.3 10*3/uL (ref 4.0–10.5)
nRBC: 0 % (ref 0.0–0.2)

## 2019-02-01 LAB — SARS CORONAVIRUS 2 (TAT 6-24 HRS): SARS Coronavirus 2: NEGATIVE

## 2019-02-05 ENCOUNTER — Ambulatory Visit: Payer: Medicaid Other | Admitting: Certified Registered"

## 2019-02-05 ENCOUNTER — Encounter: Payer: Self-pay | Admitting: *Deleted

## 2019-02-05 ENCOUNTER — Ambulatory Visit
Admission: RE | Admit: 2019-02-05 | Discharge: 2019-02-05 | Disposition: A | Discharge status: Home / Self Care | Payer: Medicaid Other | Attending: Obstetrics and Gynecology | Admitting: Obstetrics and Gynecology

## 2019-02-05 ENCOUNTER — Encounter
Admission: RE | Disposition: A | Discharge status: Home / Self Care | Payer: Self-pay | Source: Home / Self Care | Attending: Obstetrics and Gynecology

## 2019-02-05 ENCOUNTER — Other Ambulatory Visit: Payer: Self-pay

## 2019-02-05 DIAGNOSIS — Z79899 Other long term (current) drug therapy: Secondary | ICD-10-CM | POA: Diagnosis not present

## 2019-02-05 DIAGNOSIS — Z793 Long term (current) use of hormonal contraceptives: Secondary | ICD-10-CM | POA: Diagnosis not present

## 2019-02-05 DIAGNOSIS — Z87891 Personal history of nicotine dependence: Secondary | ICD-10-CM | POA: Diagnosis not present

## 2019-02-05 DIAGNOSIS — I10 Essential (primary) hypertension: Secondary | ICD-10-CM | POA: Insufficient documentation

## 2019-02-05 DIAGNOSIS — R51 Headache: Secondary | ICD-10-CM | POA: Insufficient documentation

## 2019-02-05 DIAGNOSIS — Z6841 Body Mass Index (BMI) 40.0 and over, adult: Secondary | ICD-10-CM | POA: Diagnosis not present

## 2019-02-05 DIAGNOSIS — N939 Abnormal uterine and vaginal bleeding, unspecified: Secondary | ICD-10-CM | POA: Insufficient documentation

## 2019-02-05 DIAGNOSIS — E669 Obesity, unspecified: Secondary | ICD-10-CM | POA: Diagnosis not present

## 2019-02-05 DIAGNOSIS — N840 Polyp of corpus uteri: Secondary | ICD-10-CM | POA: Insufficient documentation

## 2019-02-05 HISTORY — PX: HYSTEROSCOPY WITH D & C: SHX1775

## 2019-02-05 LAB — POCT I-STAT, CHEM 8
BUN: 14 mg/dL (ref 6–20)
Calcium, Ion: 1.17 mmol/L (ref 1.15–1.40)
Chloride: 103 mmol/L (ref 98–111)
Creatinine, Ser: 0.8 mg/dL (ref 0.44–1.00)
Glucose, Bld: 136 mg/dL — ABNORMAL HIGH (ref 70–99)
HCT: 42 % (ref 36.0–46.0)
Hemoglobin: 14.3 g/dL (ref 12.0–15.0)
Potassium: 3.1 mmol/L — ABNORMAL LOW (ref 3.5–5.1)
Sodium: 139 mmol/L (ref 135–145)
TCO2: 24 mmol/L (ref 22–32)

## 2019-02-05 LAB — POCT PREGNANCY, URINE: Preg Test, Ur: NEGATIVE

## 2019-02-05 LAB — ABO/RH: ABO/RH(D): O POS

## 2019-02-05 SURGERY — DILATATION AND CURETTAGE /HYSTEROSCOPY
Anesthesia: General

## 2019-02-05 MED ORDER — PROPOFOL 10 MG/ML IV BOLUS
INTRAVENOUS | Status: AC
  Filled 2019-02-05: qty 40

## 2019-02-05 MED ORDER — FAMOTIDINE 20 MG PO TABS
20.0000 mg | ORAL_TABLET | Freq: Once | ORAL | Status: AC
  Administered 2019-02-05: 20 mg via ORAL

## 2019-02-05 MED ORDER — MIDAZOLAM HCL 2 MG/2ML IJ SOLN
INTRAMUSCULAR | Status: AC
  Filled 2019-02-05: qty 2

## 2019-02-05 MED ORDER — SUCCINYLCHOLINE CHLORIDE 20 MG/ML IJ SOLN
INTRAMUSCULAR | Status: DC | PRN
  Administered 2019-02-05: 140 mg via INTRAVENOUS

## 2019-02-05 MED ORDER — ONDANSETRON HCL 4 MG/2ML IJ SOLN
INTRAMUSCULAR | Status: DC | PRN
  Administered 2019-02-05: 4 mg via INTRAVENOUS

## 2019-02-05 MED ORDER — LACTATED RINGERS IV SOLN: INTRAVENOUS | Status: DC

## 2019-02-05 MED ORDER — LIDOCAINE HCL (CARDIAC) PF 100 MG/5ML IV SOSY
PREFILLED_SYRINGE | INTRAVENOUS | Status: DC | PRN
  Administered 2019-02-05: 100 mg via INTRAVENOUS

## 2019-02-05 MED ORDER — MIDAZOLAM HCL 2 MG/2ML IJ SOLN
INTRAMUSCULAR | Status: DC | PRN
  Administered 2019-02-05: 2 mg via INTRAVENOUS

## 2019-02-05 MED ORDER — FENTANYL CITRATE (PF) 100 MCG/2ML IJ SOLN
INTRAMUSCULAR | Status: AC
  Filled 2019-02-05: qty 2

## 2019-02-05 MED ORDER — FENTANYL CITRATE (PF) 100 MCG/2ML IJ SOLN
INTRAMUSCULAR | Status: DC | PRN
  Administered 2019-02-05: 100 ug via INTRAVENOUS

## 2019-02-05 MED ORDER — ONDANSETRON HCL 4 MG/2ML IJ SOLN: 4.0000 mg | Freq: Once | INTRAMUSCULAR | Status: DC | PRN

## 2019-02-05 MED ORDER — LACTATED RINGERS IV SOLN
INTRAVENOUS | Status: DC
  Administered 2019-02-05: 12:00:00 via INTRAVENOUS

## 2019-02-05 MED ORDER — KETOROLAC TROMETHAMINE 30 MG/ML IJ SOLN
INTRAMUSCULAR | Status: DC | PRN
  Administered 2019-02-05: 30 mg via INTRAVENOUS

## 2019-02-05 MED ORDER — PROPOFOL 10 MG/ML IV BOLUS
INTRAVENOUS | Status: DC | PRN
  Administered 2019-02-05: 200 mg via INTRAVENOUS

## 2019-02-05 MED ORDER — IBUPROFEN 600 MG PO TABS: 600.0000 mg | ORAL_TABLET | Freq: Four times a day (QID) | ORAL | 0 refills | Status: DC | PRN

## 2019-02-05 MED ORDER — DEXAMETHASONE SODIUM PHOSPHATE 10 MG/ML IJ SOLN
INTRAMUSCULAR | Status: DC | PRN
  Administered 2019-02-05: 5 mg via INTRAVENOUS

## 2019-02-05 MED ORDER — ACETAMINOPHEN 500 MG PO TABS: 1000.0000 mg | ORAL_TABLET | Freq: Four times a day (QID) | ORAL | 0 refills | Status: DC | PRN

## 2019-02-05 MED ORDER — FENTANYL CITRATE (PF) 100 MCG/2ML IJ SOLN
25.0000 ug | INTRAMUSCULAR | Status: DC | PRN
  Administered 2019-02-05: 25 ug via INTRAVENOUS

## 2019-02-05 SURGICAL SUPPLY — 18 items
CATH ROBINSON RED A/P 16FR (CATHETERS) ×3 IMPLANT
DEVICE MYOSURE LITE (MISCELLANEOUS) IMPLANT
DEVICE MYOSURE REACH (MISCELLANEOUS) IMPLANT
ELECT REM PT RETURN 9FT ADLT (ELECTROSURGICAL)
ELECTRODE REM PT RTRN 9FT ADLT (ELECTROSURGICAL) IMPLANT
GLOVE BIOGEL PI IND STRL 6.5 (GLOVE) ×2 IMPLANT
GLOVE BIOGEL PI INDICATOR 6.5 (GLOVE) ×4
GLOVE SURG SYN 6.5 ES PF (GLOVE) ×3 IMPLANT
GOWN STRL REUS W/ TWL LRG LVL3 (GOWN DISPOSABLE) ×2 IMPLANT
GOWN STRL REUS W/TWL LRG LVL3 (GOWN DISPOSABLE) ×4
KIT PROCEDURE FLUENT (KITS) ×3 IMPLANT
PACK DNC HYST (MISCELLANEOUS) ×3 IMPLANT
PAD OB MATERNITY 4.3X12.25 (PERSONAL CARE ITEMS) ×3 IMPLANT
PAD PREP 24X41 OB/GYN DISP (PERSONAL CARE ITEMS) ×3 IMPLANT
SEAL ROD LENS SCOPE MYOSURE (ABLATOR) ×3 IMPLANT
SOL .9 NS 3000ML IRR  AL (IV SOLUTION) ×2
SOL .9 NS 3000ML IRR UROMATIC (IV SOLUTION) ×1 IMPLANT
TOWEL OR 17X26 4PK STRL BLUE (TOWEL DISPOSABLE) ×3 IMPLANT

## 2019-02-05 NOTE — Interval H&P Note (Signed)
History and Physical Interval Note:  02/05/2019 12:22 PM  Amber Burnett  has presented today for surgery, with the diagnosis of Abnormal uterine bleeding N93.9 Endometrial polyp N84.0.  The various methods of treatment have been discussed with the patient and family. After consideration of risks, benefits and other options for treatment, the patient has consented to  Procedure(s): DILATATION AND CURETTAGE /HYSTEROSCOPY (N/A) as a surgical intervention.  The patient's history has been reviewed, patient examined, no change in status, stable for surgery.  I have reviewed the patient's chart and labs.  Questions were answered to the patient's satisfaction.     [place]

## 2019-02-05 NOTE — Transfer of Care (Signed)
Immediate Anesthesia Transfer of Care Note  Patient: Amber Burnett  Procedure(s) Performed: DILATATION AND CURETTAGE /HYSTEROSCOPY (N/A )  Patient Location: PACU  Anesthesia Type:General  Level of Consciousness: awake, alert  and oriented  Airway & Oxygen Therapy: Patient Spontanous Breathing and Patient connected to face mask oxygen  Post-op Assessment: Report given to RN and Post -op Vital signs reviewed and stable  Post vital signs: Reviewed and stable  Last Vitals:  Vitals Value Taken Time  BP 103/88 02/05/19 1341  Temp 36.2 C 02/05/19 1341  Pulse 102 02/05/19 1344  Resp 5 02/05/19 1344  SpO2 100 % 02/05/19 1344  Vitals shown include unvalidated device data.  Last Pain: There were no vitals filed for this visit.       Complications: No apparent anesthesia complications

## 2019-02-05 NOTE — Anesthesia Preprocedure Evaluation (Signed)
Anesthesia Evaluation  Patient identified by MRN, date of birth, ID band Patient awake    Reviewed: Allergy & Precautions, NPO status , Patient's Chart, lab work & pertinent test results  History of Anesthesia Complications Negative for: history of anesthetic complications  Airway Mallampati: II  TM Distance: >3 FB Neck ROM: Full    Dental no notable dental hx.    Pulmonary neg sleep apnea, neg COPD, former smoker,    breath sounds clear to auscultation- rhonchi (-) wheezing      Cardiovascular hypertension, Pt. on medications (-) CAD, (-) Past MI, (-) Cardiac Stents and (-) CABG  Rhythm:Regular Rate:Normal - Systolic murmurs and - Diastolic murmurs    Neuro/Psych neg Seizures negative neurological ROS  negative psych ROS   GI/Hepatic negative GI ROS, Neg liver ROS,   Endo/Other  negative endocrine ROSneg diabetes  Renal/GU negative Renal ROS     Musculoskeletal negative musculoskeletal ROS (+)   Abdominal (+) + obese,   Peds  Hematology negative hematology ROS (+)   Anesthesia Other Findings Past Medical History: No date: HTN (hypertension)   Reproductive/Obstetrics                             Anesthesia Physical Anesthesia Plan  ASA: II  Anesthesia Plan: General   Post-op Pain Management:    Induction: Intravenous  PONV Risk Score and Plan: 2 and Ondansetron, Dexamethasone and Midazolam  Airway Management Planned: Oral ETT  Additional Equipment:   Intra-op Plan:   Post-operative Plan: Extubation in OR  Informed Consent: I have reviewed the patients History and Physical, chart, labs and discussed the procedure including the risks, benefits and alternatives for the proposed anesthesia with the patient or authorized representative who has indicated his/her understanding and acceptance.     Dental advisory given  Plan Discussed with: CRNA and  Anesthesiologist  Anesthesia Plan Comments:         Anesthesia Quick Evaluation

## 2019-02-05 NOTE — Anesthesia Post-op Follow-up Note (Signed)
Anesthesia QCDR form completed.        

## 2019-02-05 NOTE — Op Note (Signed)
Operative Note  02/05/2019  PRE-OP DIAGNOSIS: Abnormal uterine bleeding, endometrial polyp  POST-OP DIAGNOSIS: Abnormal uterine bleeding   SURGEON: Adrian Prows MD  PROCEDURE: Procedure(s): DILATATION AND CURETTAGE /HYSTEROSCOPY   ANESTHESIA: Choice   ESTIMATED BLOOD LOSS: 5 cc   SPECIMENS:  Endometrial curettings  FLUID DEFICIT: 098 cc  COMPLICATIONS: None  DISPOSITION: PACU - hemodynamically stable.  CONDITION: stable  FINDINGS: Exam under anesthesia revealed  10 cm uterus with bilateral adnexa without masses or fullness. Hysteroscopy revealed normal uterine cavity with bilateral tubal ostia and normal appearing endocervical canal. Endometrium appeared thick.   PROCEDURE IN DETAIL: After informed consent was obtained, the patient was taken to the operating room where anesthesia was obtained without difficulty. The patient was positioned in the dorsal lithotomy position in [place]. The patient's bladder was catheterized with an in and out foley catheter. The patient was examined under anesthesia, with the above noted findings. The weighted speculum was placed inside the patient's vagina, and the the anterior lip of the cervix was seen and grasped with the tenaculum.  The uterine cavity was sounded to 10cm, and then the cervix was progressively dilated to a 16 French-Pratt dilator. The 0 degree hysteroscope was introduced, with saline fluid used to distend the intrauterine cavity, with the above noted findings.  The hysteroscope was removed.  The uterine cavity was curetted until a gritty texture was noted, yielding endometrial curettings. All instruments were removed, with excellent hemostasis noted throughout. She was then taken out of dorsal lithotomy. Minimal discrepancy in fluid was noted.  The patient tolerated the procedure well. Sponge, lap and needle counts were correct x2. The patient was taken to recovery room in excellent condition.  Adrian Prows  MD [facility], [facility] 02/05/2019 1:33 PM

## 2019-02-05 NOTE — Discharge Instructions (Signed)
Hysteroscopy, Care After °This sheet gives you information about how to care for yourself after your procedure. Your health care provider may also give you more specific instructions. If you have problems or questions, contact your health care provider. °What can I expect after the procedure? °After the procedure, it is common to have: °· Cramping. °· Bleeding. This can vary from light spotting to menstrual-like bleeding. °Follow these instructions at home: °Activity °· Rest for 1-2 days after the procedure. °· Do not douche, use tampons, or have sex for 2 weeks after the procedure, or until your health care provider approves. °· Do not drive for 24 hours after the procedure, or for as long as told by your health care provider. °· Do not drive, use heavy machinery, or drink alcohol while taking prescription pain medicines. °Medicines ° °· Take over-the-counter and prescription medicines only as told by your health care provider. °· Do not take aspirin during recovery. It can increase the risk of bleeding. °General instructions °· Do not take baths, swim, or use a hot tub until your health care provider approves. Take showers instead of baths for 2 weeks, or for as long as told by your health care provider. °· To prevent or treat constipation while you are taking prescription pain medicine, your health care provider may recommend that you: °? Drink enough fluid to keep your urine clear or pale yellow. °? Take over-the-counter or prescription medicines. °? Eat foods that are high in fiber, such as fresh fruits and vegetables, whole grains, and beans. °? Limit foods that are high in fat and processed sugars, such as fried and sweet foods. °· Keep all follow-up visits as told by your health care provider. This is important. °Contact a health care provider if: °· You feel dizzy or lightheaded. °· You feel nauseous. °· You have abnormal vaginal discharge. °· You have a rash. °· You have pain that does not get better with  medicine. °· You have chills. °Get help right away if: °· You have bleeding that is heavier than a normal menstrual period. °· You have a fever. °· You have pain or cramps that get worse. °· You develop new abdominal pain. °· You faint. °· You have pain in your shoulders. °· You have shortness of breath. °Summary °· After the procedure, you may have cramping and some vaginal bleeding. °· Do not douche, use tampons, or have sex for 2 weeks after the procedure, or until your health care provider approves. °· Do not take baths, swim, or use a hot tub until your health care provider approves. Take showers instead of baths for 2 weeks, or for as long as told by your health care provider. °· Report any unusual symptoms to your health care provider. °· Keep all follow-up visits as told by your health care provider. This is important. °This information is not intended to replace advice given to you by your health care provider. Make sure you discuss any questions you have with your health care provider. °Document Released: 02/13/2013 Document Revised: 04/07/2017 Document Reviewed: 05/24/2016 °Elsevier Patient Education © 2020 [employer] ° °AMBULATORY SURGERY  °DISCHARGE INSTRUCTIONS ° ° °1) The drugs that you were given will stay in your system until tomorrow so for the next 24 hours you should not: ° °A) Drive an automobile °B) Make any legal decisions °C) Drink any alcoholic beverage ° ° °2) You may resume regular meals tomorrow.  Today it is better to start with liquids and gradually work   up to solid foods. ° °You may eat anything you prefer, but it is better to start with liquids, then soup and crackers, and gradually work up to solid foods. ° ° °3) Please notify your doctor immediately if you have any unusual bleeding, trouble breathing, redness and pain at the surgery site, drainage, fever, or pain not relieved by medication. ° ° ° °4) Additional Instructions: ° ° ° ° ° ° ° °Please contact your physician with any  problems or Same Day Surgery at [phone], Monday through Friday 6 am to 4 pm, or [place] [facility] [place] Main number at [phone]. ° °

## 2019-02-05 NOTE — Anesthesia Postprocedure Evaluation (Signed)
Anesthesia Post Note  Patient: Amber Burnett  Procedure(s) Performed: DILATATION AND CURETTAGE /HYSTEROSCOPY (N/A )  Patient location during evaluation: PACU Anesthesia Type: General Level of consciousness: awake and alert and oriented Pain management: pain level controlled Vital Signs Assessment: post-procedure vital signs reviewed and stable Respiratory status: spontaneous breathing, nonlabored ventilation and respiratory function stable Cardiovascular status: blood pressure returned to baseline and stable Postop Assessment: no signs of nausea or vomiting Anesthetic complications: no     Last Vitals:  Vitals:   02/05/19 1407 02/05/19 1430  BP:    Pulse: 87 79  Resp: 16 19  Temp:    SpO2: 100% 100%    Last Pain:  Vitals:   02/05/19 1430  PainSc: 5                  [name]

## 2019-02-05 NOTE — Anesthesia Procedure Notes (Signed)
Procedure Name: Intubation Date/Time: 02/05/2019 12:45 PM Performed by: Chanetta Marshall, CRNA Pre-anesthesia Checklist: Patient identified, Emergency Drugs available, Suction available and Patient being monitored Patient Re-evaluated:Patient Re-evaluated prior to induction Oxygen Delivery Method: Circle system utilized Preoxygenation: Pre-oxygenation with 100% oxygen Induction Type: IV induction Ventilation: Mask ventilation without difficulty Laryngoscope Size: McGraph and 3 Grade View: Grade I Tube type: Oral Number of attempts: 1 Airway Equipment and Method: Stylet and Video-laryngoscopy Placement Confirmation: ETT inserted through vocal cords under direct vision,  positive ETCO2,  breath sounds checked- equal and bilateral and CO2 detector Secured at: 21 cm Tube secured with: Tape Dental Injury: Teeth and Oropharynx as per pre-operative assessment

## 2019-02-06 LAB — SURGICAL PATHOLOGY

## 2019-02-07 NOTE — Progress Notes (Signed)
WNL, released to mychart.

## 2019-02-13 ENCOUNTER — Ambulatory Visit (INDEPENDENT_AMBULATORY_CARE_PROVIDER_SITE_OTHER): Payer: Medicaid Other | Admitting: Obstetrics and Gynecology

## 2019-02-13 ENCOUNTER — Other Ambulatory Visit: Payer: Self-pay

## 2019-02-13 ENCOUNTER — Telehealth: Payer: Self-pay | Admitting: Obstetrics and Gynecology

## 2019-02-13 ENCOUNTER — Encounter: Payer: Self-pay | Admitting: Obstetrics and Gynecology

## 2019-02-13 VITALS — BP 130/82 | HR 104 | Ht 63.0 in | Wt 301.0 lb

## 2019-02-13 DIAGNOSIS — Z9889 Other specified postprocedural states: Secondary | ICD-10-CM | POA: Diagnosis not present

## 2019-02-13 NOTE — Telephone Encounter (Signed)
mirena with CS on 10/26 at 210

## 2019-02-13 NOTE — Patient Instructions (Signed)

## 2019-02-13 NOTE — Progress Notes (Signed)
  Postoperative Follow-up Patient presents post op from diagnostic hysteroscopy for abnormal uterine bleeding and suspected endometrial polyp, 1 week ago.  Subjective: Patient reports marked improvement in her preop symptoms. Eating a regular diet without difficulty. The patient is not having any pain.  Activity: normal activities of daily living. Patient reports additional symptom's since surgery of None.  Objective: BP 130/82   Pulse (!) 104   Ht 5\' 3"  (1.6 m)   Wt (!) 301 lb (136.5 kg)   BMI 53.32 kg/m  Physical Exam Constitutional:      Appearance: She is well-developed.  Genitourinary:     Vagina and uterus normal.     No lesions in the vagina.     No cervical motion tenderness.     No right or left adnexal mass present.  HENT:     Head: Normocephalic and atraumatic.  Neck:     Musculoskeletal: Neck supple.     Thyroid: No thyromegaly.  Cardiovascular:     Rate and Rhythm: Normal rate and regular rhythm.     Heart sounds: Normal heart sounds.  Pulmonary:     Effort: Pulmonary effort is normal.     Breath sounds: Normal breath sounds.  Chest:     Breasts:        Right: No inverted nipple, mass, nipple discharge or skin change.        Left: No inverted nipple, mass, nipple discharge or skin change.  Abdominal:     General: Bowel sounds are normal. There is no distension.     Palpations: Abdomen is soft. There is no mass.  Neurological:     Mental Status: She is alert and oriented to person, place, and time.  Skin:    General: Skin is warm and dry.  Psychiatric:        Behavior: Behavior normal.        Thought Content: Thought content normal.        Judgment: Judgment normal.  Vitals signs reviewed.     Assessment: s/p :  diagnostic hysteroscopy, tubal ligation, endometrial ablation and LEEP stable  Plan: Patient has done well after surgery with no apparent complications.  I have discussed the post-operative course to date, and the expected progress moving  forward.  The patient understands what complications to be concerned about.  I will see the patient in routine follow up, or sooner if needed.    Activity plan: No restriction.  Pelvic rest.  Discussed options for OCP vs IUD to help with irregular bleeding.  Will return in 2 weeks for IUD insertion Pregnancy test today negative.   Myleigh Amara R Declan Adamson 02/13/2019, 2:38 PM

## 2019-02-18 NOTE — Telephone Encounter (Signed)
Noted. Will order to arrive by apt date/time. 

## 2019-03-04 ENCOUNTER — Ambulatory Visit: Payer: Medicaid Other | Admitting: Obstetrics and Gynecology

## 2019-03-05 NOTE — Telephone Encounter (Signed)
Cancel/No show

## 2019-04-07 ENCOUNTER — Emergency Department
Admission: EM | Admit: 2019-04-07 | Discharge: 2019-04-07 | Disposition: A | Discharge status: Home / Self Care | Payer: Medicaid Other | Attending: Emergency Medicine | Admitting: Emergency Medicine

## 2019-04-07 ENCOUNTER — Encounter: Payer: Self-pay | Admitting: Emergency Medicine

## 2019-04-07 ENCOUNTER — Other Ambulatory Visit: Payer: Self-pay

## 2019-04-07 DIAGNOSIS — R200 Anesthesia of skin: Secondary | ICD-10-CM | POA: Diagnosis present

## 2019-04-07 DIAGNOSIS — Z87891 Personal history of nicotine dependence: Secondary | ICD-10-CM | POA: Insufficient documentation

## 2019-04-07 DIAGNOSIS — R202 Paresthesia of skin: Secondary | ICD-10-CM | POA: Diagnosis not present

## 2019-04-07 DIAGNOSIS — M5416 Radiculopathy, lumbar region: Secondary | ICD-10-CM

## 2019-04-07 DIAGNOSIS — Z79899 Other long term (current) drug therapy: Secondary | ICD-10-CM | POA: Insufficient documentation

## 2019-04-07 DIAGNOSIS — I10 Essential (primary) hypertension: Secondary | ICD-10-CM | POA: Diagnosis not present

## 2019-04-07 MED ORDER — MELOXICAM 15 MG PO TABS: 15.0000 mg | ORAL_TABLET | Freq: Every day | ORAL | 0 refills | Status: DC

## 2019-04-07 MED ORDER — MELOXICAM 7.5 MG PO TABS
15.0000 mg | ORAL_TABLET | Freq: Once | ORAL | Status: AC
  Administered 2019-04-07: 15 mg via ORAL
  Filled 2019-04-07: qty 2

## 2019-04-07 NOTE — ED Triage Notes (Signed)
Patient with complaint of tingling and tender to the touch to the top of bilateral feet times one week.

## 2019-04-07 NOTE — ED Provider Notes (Signed)
[facility] Emergency Department Provider Note  ____________________________________________  Time seen: Approximately 10:15 PM  I have reviewed the triage vital signs and the nursing notes.   HISTORY  Chief Complaint Foot Pain    HPI Amber Burnett is a 33 y.o. female who presents the emergency department complaining of a numbness and tingling sensation to the tops of bilateral feet extending into her lower legs.  Patient reports that symptoms have been present x1 week.  No injury to either lower extremity.  Patient denies any erythema, edema.  Patient denies any frank pain.  No loss of sensation.  Patient denies any back pain or history of sciatica.  No medications prior to arrival.  No other complaints.          Past Medical History:  Diagnosis Date  . HTN (hypertension)     Patient Active Problem List   Diagnosis Date Noted  . Herpes genitalis in women 01/30/2019  . Obesity, morbid, BMI 50 or higher (HCC) 01/30/2019    Past Surgical History:  Procedure Laterality Date  . DILATION AND CURETTAGE OF UTERUS    . HYSTEROSCOPY W/D&C N/A 02/05/2019   Procedure: DILATATION AND CURETTAGE /HYSTEROSCOPY;  Surgeon: Natale Milch, MD;  Location: [facility] ORS;  Service: Gynecology;  Laterality: N/A;  . TONSILLECTOMY AND ADENOIDECTOMY      Prior to Admission medications   Medication Sig Start Date End Date Taking? Authorizing Provider  acetaminophen (TYLENOL) 500 MG tablet Take 2 tablets (1,000 mg total) by mouth every 6 (six) hours as needed. Patient not taking: Reported on 02/13/2019 02/05/19 02/05/20  Natale Milch, MD  hydrochlorothiazide (HYDRODIURIL) 25 MG tablet Take 1 tablet (25 mg total) by mouth daily. 06/07/18   Minna Antis, MD  ibuprofen (ADVIL) 600 MG tablet Take 1 tablet (600 mg total) by mouth every 6 (six) hours as needed. Patient not taking: Reported on 02/13/2019 02/05/19   Natale Milch, MD  meloxicam (MOBIC) 15 MG  tablet Take 1 tablet (15 mg total) by mouth daily. 04/07/19   Kabrea Seeney, Delorise Royals, PA-C  norethindrone (MICRONOR) 0.35 MG tablet Take 1 tablet (0.35 mg total) by mouth daily. 01/11/19   Schuman, Jaquelyn Bitter, MD  valACYclovir (VALTREX) 1000 MG tablet Take 1,000 mg by mouth 2 (two) times daily as needed.    [provider]    Allergies Patient has no known allergies.  No family history on file.  Social History Social History   Tobacco Use  . Smoking status: Former Games developer  . Smokeless tobacco: Never Used  Substance Use Topics  . Alcohol use: No  . Drug use: Not Currently    Types: Marijuana     Review of Systems  Constitutional: No fever/chills Eyes: No visual changes. No discharge ENT: No upper respiratory complaints. Cardiovascular: no chest pain. Respiratory: no cough. No SOB. Gastrointestinal: No abdominal pain.  No nausea, no vomiting.  No diarrhea.  No constipation. Musculoskeletal: Bilateral lower extremity numbness and tingling Skin: Negative for rash, abrasions, lacerations, ecchymosis. Neurological: Negative for headaches, focal weakness or numbness. 10-point ROS otherwise negative.  ____________________________________________   PHYSICAL EXAM:  VITAL SIGNS: ED Triage Vitals  Enc Vitals Group     BP 04/07/19 2123 129/87     Pulse Rate 04/07/19 2123 88     Resp 04/07/19 2123 18     Temp 04/07/19 2123 99.5 F (37.5 C)     Temp Source 04/07/19 2123 Oral     SpO2 04/07/19 2123 95 %  Weight 04/07/19 2123 285 lb (129.3 kg)     Height 04/07/19 2123 5\' 3"  (1.6 m)     Head Circumference --      Peak Flow --      Pain Score 04/07/19 2127 7     Pain Loc --      Pain Edu? --      Excl. in GC? --      Constitutional: Alert and oriented. Well appearing and in no acute distress. Eyes: Conjunctivae are normal. PERRL. EOMI. Head: Atraumatic. ENT:      Ears:       Nose: No congestion/rhinnorhea.      Mouth/Throat: Mucous membranes are moist.   Neck: No stridor.    Cardiovascular: Normal rate, regular rhythm. Normal S1 and S2.  Good peripheral circulation. Respiratory: Normal respiratory effort without tachypnea or retractions. Lungs CTAB. Good air entry to the bases with no decreased or absent breath sounds. Musculoskeletal: Full range of motion to all extremities. No gross deformities appreciated.  Visualization of bilateral lower extremities reveal no visible signs of trauma.  No edema, erythema, laceration, abrasions.  No deformity or ecchymosis.  Full range of motion of all joints.  Patient has good sensation to all dermatomal distributions of the lower extremity.  Sensation is equal bilaterally.  Dorsalis pedis pulse intact.  Examination of the lumbar spine is unremarkable.  Examination of bilateral hips and bilateral knees is unremarkable. Neurologic:  Normal speech and language. No gross focal neurologic deficits are appreciated.  Skin:  Skin is warm, dry and intact. No rash noted. Psychiatric: Mood and affect are normal. Speech and behavior are normal. Patient exhibits appropriate insight and judgement.   ____________________________________________   LABS (all labs ordered are listed, but only abnormal results are displayed)  Labs Reviewed - No data to display ____________________________________________  EKG   ____________________________________________  RADIOLOGY   No results found.  ____________________________________________    PROCEDURES  Procedure(s) performed:    Procedures    Medications - No data to display   ____________________________________________   INITIAL IMPRESSION / ASSESSMENT AND PLAN / ED COURSE  Pertinent labs & imaging results that were available during my care of the patient were reviewed by me and considered in my medical decision making (see chart for details).  Review of the [place] CSRS was performed in accordance of the [facility] prior to dispensing any controlled drugs.            Patient's diagnosis is consistent with lumbar radiculopathy.  Patient presented to the emergency department complaining of numbness and tingling to bilateral lower extremity.  Sensation changes are in the feet.  Patient describes it as a "prickly" sensation.  No sensation loss on exam.  Exam is reassuring with no acute findings.  I suspect that symptoms are originating in the lumbar region.  Patient has no lumbar pain.  No indication for imaging or labs at this time.  Patient will be treated with meloxicam for symptom relief.  Follow-up primary care as needed.  Patient is given ED precautions to return to the ED for any worsening or new symptoms.     ____________________________________________  FINAL CLINICAL IMPRESSION(S) / ED DIAGNOSES  Final diagnoses:  Lumbar radiculopathy      NEW MEDICATIONS STARTED DURING THIS VISIT:  ED Discharge Orders         Ordered    meloxicam (MOBIC) 15 MG tablet  Daily     04/07/19 2222  This chart was dictated using voice recognition software/Dragon. Despite best efforts to proofread, errors can occur which can change the meaning. Any change was purely unintentional.    Darletta Moll, PA-C 04/07/19 2222    Blake Divine, MD 04/08/19 0000

## 2019-04-10 ENCOUNTER — Other Ambulatory Visit: Payer: Self-pay

## 2019-04-10 DIAGNOSIS — Z20822 Contact with and (suspected) exposure to covid-19: Secondary | ICD-10-CM

## 2019-04-13 LAB — NOVEL CORONAVIRUS, NAA: SARS-CoV-2, NAA: NOT DETECTED

## 2019-04-17 ENCOUNTER — Other Ambulatory Visit: Payer: Self-pay

## 2019-04-17 ENCOUNTER — Emergency Department: Payer: Medicaid Other

## 2019-04-17 DIAGNOSIS — I10 Essential (primary) hypertension: Secondary | ICD-10-CM | POA: Diagnosis not present

## 2019-04-17 DIAGNOSIS — Z87891 Personal history of nicotine dependence: Secondary | ICD-10-CM | POA: Diagnosis not present

## 2019-04-17 DIAGNOSIS — Z79899 Other long term (current) drug therapy: Secondary | ICD-10-CM | POA: Diagnosis not present

## 2019-04-17 DIAGNOSIS — M79662 Pain in left lower leg: Secondary | ICD-10-CM | POA: Insufficient documentation

## 2019-04-17 NOTE — ED Triage Notes (Signed)
Pt co left lower leg pain that started a week ago. Has been here for the same but unsure of diagnosis. Pt states worse when she walks and moves. Given meloxicam for the same without relief.

## 2019-04-18 ENCOUNTER — Emergency Department
Admission: EM | Admit: 2019-04-18 | Discharge: 2019-04-18 | Disposition: A | Discharge status: Home / Self Care | Payer: Medicaid Other | Attending: Emergency Medicine | Admitting: Emergency Medicine

## 2019-04-18 DIAGNOSIS — M79605 Pain in left leg: Secondary | ICD-10-CM

## 2019-04-18 NOTE — ED Provider Notes (Signed)
[facility] Emergency Department Provider Note  ____________________________________________  Time seen: Approximately 3:33 AM  I have reviewed the triage vital signs and the nursing notes.   HISTORY  Chief Complaint Leg Pain   HPI Amber Burnett is a 33 y.o. female the history of hypertension presents for evaluation of leg pain.  Patient reports a week of hypersensitivity in the lateral aspect of her left calf.  She has been taking meloxicam for the same without relief.  No swelling or erythema, no cuts, no trauma, no rash.  Patient reports that this evening the pain became worse which prompted her visit to the emergency room.  No prior history of PE or DVT, no recent travel immobilization, no exogenous hormones.   Past Medical History:  Diagnosis Date  . HTN (hypertension)     Patient Active Problem List   Diagnosis Date Noted  . Herpes genitalis in women 01/30/2019  . Obesity, morbid, BMI 50 or higher ([place]) 01/30/2019    Past Surgical History:  Procedure Laterality Date  . DILATION AND CURETTAGE OF UTERUS    . HYSTEROSCOPY W/D&C N/A 02/05/2019   Procedure: DILATATION AND CURETTAGE /HYSTEROSCOPY;  Surgeon: Homero Fellers, MD;  Location: [facility] ORS;  Service: Gynecology;  Laterality: N/A;  . TONSILLECTOMY AND ADENOIDECTOMY      Prior to Admission medications   Medication Sig Start Date End Date Taking? Authorizing Provider  acetaminophen (TYLENOL) 500 MG tablet Take 2 tablets (1,000 mg total) by mouth every 6 (six) hours as needed. Patient not taking: Reported on 02/13/2019 02/05/19 02/05/20  Homero Fellers, MD  hydrochlorothiazide (HYDRODIURIL) 25 MG tablet Take 1 tablet (25 mg total) by mouth daily. 06/07/18   Harvest Dark, MD  ibuprofen (ADVIL) 600 MG tablet Take 1 tablet (600 mg total) by mouth every 6 (six) hours as needed. Patient not taking: Reported on 02/13/2019 02/05/19   Homero Fellers, MD  meloxicam (MOBIC) 15 MG  tablet Take 1 tablet (15 mg total) by mouth daily. 04/07/19   Cuthriell, Charline Bills, PA-C  norethindrone (MICRONOR) 0.35 MG tablet Take 1 tablet (0.35 mg total) by mouth daily. 01/11/19   Schuman, Stefanie Libel, MD  valACYclovir (VALTREX) 1000 MG tablet Take 1,000 mg by mouth 2 (two) times daily as needed.    [provider]    Allergies Patient has no known allergies.  No family history on file.  Social History Social History   Tobacco Use  . Smoking status: Former Research scientist (life sciences)  . Smokeless tobacco: Never Used  Substance Use Topics  . Alcohol use: No  . Drug use: Not Currently    Types: Marijuana    Review of Systems  Constitutional: Negative for fever. Eyes: Negative for visual changes. ENT: Negative for sore throat. Neck: No neck pain  Cardiovascular: Negative for chest pain. Respiratory: Negative for shortness of breath. Gastrointestinal: Negative for abdominal pain, vomiting or diarrhea. Genitourinary: Negative for dysuria. Musculoskeletal: Negative for back pain. + LLE pain Skin: Negative for rash. Neurological: Negative for headaches, weakness or numbness. Psych: No SI or HI  ____________________________________________   PHYSICAL EXAM:  VITAL SIGNS: ED Triage Vitals  Enc Vitals Group     BP 04/17/19 2333 (!) 147/90     Pulse Rate 04/17/19 2333 85     Resp 04/17/19 2333 17     Temp 04/17/19 2333 97.9 F (36.6 C)     Temp Source 04/17/19 2333 Oral     SpO2 04/17/19 2333 100 %  Weight 04/17/19 2335 285 lb (129.3 kg)     Height 04/17/19 2335 5\' 3"  (1.6 m)     Head Circumference --      Peak Flow --      Pain Score 04/17/19 2337 6     Pain Loc --      Pain Edu? --      Excl. in GC? --     Constitutional: Alert and oriented. Well appearing and in no apparent distress. HEENT:      Head: Normocephalic and atraumatic.         Eyes: Conjunctivae are normal. Sclera is non-icteric.       Mouth/Throat: Mucous membranes are moist.       Neck: Supple  with no signs of meningismus. Cardiovascular: Regular rate and rhythm.  Respiratory: Normal respiratory effort.  Gastrointestinal: Soft, non tender, and non distended with positive bowel sounds. No rebound or guarding. Musculoskeletal: Nontender with normal range of motion in all extremities. No edema, cyanosis, or erythema of extremities. Neurologic: Normal speech and language. Face is symmetric. Moving all extremities. No gross focal neurologic deficits are appreciated. Skin: Skin is warm, dry and intact. No rash noted. Psychiatric: Mood and affect are normal. Speech and behavior are normal.  ____________________________________________   LABS (all labs ordered are listed, but only abnormal results are displayed)  Labs Reviewed - No data to display ____________________________________________  EKG  none  ____________________________________________  RADIOLOGY  I have personally reviewed the images performed during this visit and I agree with the Radiologist's read.   Interpretation by Radiologist:  US Venous Img Lower Unilateral Left  Result Date: 04/18/2019 CLINICAL DATA:  Pain and swelling EXAM: LEFT LOWER EXTREMITY VENOUS DOPPLER ULTRASOUND TECHNIQUE: Gray-scale sonography with graded compression, as well as color Doppler and duplex ultrasound were performed to evaluate the lower extremity deep venous systems from the level of the common femoral vein and including the common femoral, femoral, profunda femoral, popliteal and calf veins including the posterior tibial, peroneal and gastrocnemius veins when visible. The superficial great saphenous vein was also interrogated. Spectral Doppler was utilized to evaluate flow at rest and with distal augmentation maneuvers in the common femoral, femoral and popliteal veins. COMPARISON:  None. FINDINGS: Contralateral Common Femoral Vein: Respiratory phasicity is normal and symmetric with the symptomatic side. No evidence of thrombus. Normal  compressibility. Common Femoral Vein: No evidence of thrombus. Normal compressibility, respiratory phasicity and response to augmentation. Saphenofemoral Junction: No evidence of thrombus. Normal compressibility and flow on color Doppler imaging. Profunda Femoral Vein: No evidence of thrombus. Normal compressibility and flow on color Doppler imaging. Femoral Vein: No evidence of thrombus. Normal compressibility, respiratory phasicity and response to augmentation. Popliteal Vein: No evidence of thrombus. Normal compressibility, respiratory phasicity and response to augmentation. Calf Veins: No evidence of thrombus. Normal compressibility and flow on color Doppler imaging. Superficial Great Saphenous Vein: No evidence of thrombus. Normal compressibility. Venous Reflux:  Not evaluated on this exam. Other Findings:  None. IMPRESSION: No evidence of deep venous thrombosis. Electronically Signed   By: Katherine Mantlehristopher  Green M.D.   On: 04/18/2019 00:32     ____________________________________________   PROCEDURES  Procedure(s) performed: None Procedures Critical Care performed:  None ____________________________________________   INITIAL IMPRESSION / ASSESSMENT AND PLAN / ED COURSE   33 y.o. female the history of hypertension presents for evaluation of leg pain.  Patient describes the pain as a hypersensitivity on the left lateral aspect of her calf.  There is no signs of cellulitis,  ischemia, no cuts, no trauma, no bruising.  Doppler studies negative for DVT.  No signs of a rash.  Recommended topical hydration with body lotion, Tylenol and ibuprofen for pain and follow-up with primary care doctor.  Discussed my standard return precautions       As part of my medical decision making, I reviewed the following data within the electronic MEDICAL RECORD NUMBER Nursing notes reviewed and incorporated, Labs reviewed , Old chart reviewed, Radiograph reviewed , Notes from prior ED visits and [place] Controlled Substance  Database   Please note:  Patient was evaluated in Emergency Department today for the symptoms described in the history of present illness. Patient was evaluated in the context of the global COVID-19 pandemic, which necessitated consideration that the patient might be at risk for infection with the SARS-CoV-2 virus that causes COVID-19. Institutional protocols and algorithms that pertain to the evaluation of patients at risk for COVID-19 are in a state of rapid change based on information released by regulatory bodies including the CDC and federal and state organizations. These policies and algorithms were followed during the patient's care in the ED.  Some ED evaluations and interventions may be delayed as a result of limited staffing during the pandemic.   ____________________________________________   FINAL CLINICAL IMPRESSION(S) / ED DIAGNOSES   Final diagnoses:  Left leg pain      NEW MEDICATIONS STARTED DURING THIS VISIT:  ED Discharge Orders    None       Note:  This document was prepared using Dragon voice recognition software and may include unintentional dictation errors.    Nita Sickle, MD 04/18/19 [phone]

## 2019-04-18 NOTE — ED Notes (Signed)
ED Provider at bedside. 

## 2019-06-18 ENCOUNTER — Encounter: Payer: Self-pay | Admitting: Intensive Care

## 2019-06-18 ENCOUNTER — Emergency Department
Admission: EM | Admit: 2019-06-18 | Discharge: 2019-06-18 | Disposition: A | Discharge status: Home / Self Care | Payer: Medicaid Other | Attending: Emergency Medicine | Admitting: Emergency Medicine

## 2019-06-18 ENCOUNTER — Other Ambulatory Visit: Payer: Self-pay

## 2019-06-18 DIAGNOSIS — I10 Essential (primary) hypertension: Secondary | ICD-10-CM | POA: Diagnosis not present

## 2019-06-18 DIAGNOSIS — T887XXA Unspecified adverse effect of drug or medicament, initial encounter: Secondary | ICD-10-CM

## 2019-06-18 DIAGNOSIS — Z79899 Other long term (current) drug therapy: Secondary | ICD-10-CM | POA: Insufficient documentation

## 2019-06-18 DIAGNOSIS — R202 Paresthesia of skin: Secondary | ICD-10-CM | POA: Insufficient documentation

## 2019-06-18 DIAGNOSIS — Z87891 Personal history of nicotine dependence: Secondary | ICD-10-CM | POA: Diagnosis not present

## 2019-06-18 LAB — CBC WITH DIFFERENTIAL/PLATELET
Abs Immature Granulocytes: 0.03 10*3/uL (ref 0.00–0.07)
Basophils Absolute: 0 10*3/uL (ref 0.0–0.1)
Basophils Relative: 0 %
Eosinophils Absolute: 0.1 10*3/uL (ref 0.0–0.5)
Eosinophils Relative: 1 %
HCT: 42.3 % (ref 36.0–46.0)
Hemoglobin: 13.6 g/dL (ref 12.0–15.0)
Immature Granulocytes: 0 %
Lymphocytes Relative: 35 %
Lymphs Abs: 3 10*3/uL (ref 0.7–4.0)
MCH: 27.6 pg (ref 26.0–34.0)
MCHC: 32.2 g/dL (ref 30.0–36.0)
MCV: 85.8 fL (ref 80.0–100.0)
Monocytes Absolute: 0.8 10*3/uL (ref 0.1–1.0)
Monocytes Relative: 9 %
Neutro Abs: 4.6 10*3/uL (ref 1.7–7.7)
Neutrophils Relative %: 55 %
Platelets: 294 10*3/uL (ref 150–400)
RBC: 4.93 MIL/uL (ref 3.87–5.11)
RDW: 15 % (ref 11.5–15.5)
WBC: 8.5 10*3/uL (ref 4.0–10.5)
nRBC: 0 % (ref 0.0–0.2)

## 2019-06-18 LAB — COMPREHENSIVE METABOLIC PANEL
ALT: 25 U/L (ref 0–44)
AST: 22 U/L (ref 15–41)
Albumin: 4.1 g/dL (ref 3.5–5.0)
Alkaline Phosphatase: 61 U/L (ref 38–126)
Anion gap: 10 (ref 5–15)
BUN: 11 mg/dL (ref 6–20)
CO2: 28 mmol/L (ref 22–32)
Calcium: 9.1 mg/dL (ref 8.9–10.3)
Chloride: 101 mmol/L (ref 98–111)
Creatinine, Ser: 0.8 mg/dL (ref 0.44–1.00)
GFR calc Af Amer: >60 mL/min (ref >60)
GFR calc non Af Amer: >60 mL/min (ref >60)
Glucose, Bld: 106 mg/dL — ABNORMAL HIGH (ref 70–99)
Potassium: 3.4 mmol/L — ABNORMAL LOW (ref 3.5–5.1)
Sodium: 139 mmol/L (ref 135–145)
Total Bilirubin: 0.9 mg/dL (ref 0.3–1.2)
Total Protein: 8.2 g/dL — ABNORMAL HIGH (ref 6.5–8.1)

## 2019-06-18 NOTE — ED Triage Notes (Signed)
Patient reports pcp starting her on new HTN (amlodipine) meds Friday and Saturday started feeling numbness to left side of her face. Reports numbness still present. Ambulatory with no problems. Speech clear

## 2019-06-18 NOTE — ED Provider Notes (Signed)
[facility] Emergency Department Provider Note   ____________________________________________    I have reviewed the triage vital signs and the nursing notes.   HISTORY  Chief Complaint Numbness     HPI Amber Burnett is a 34 y.o. female who presents with complaints of numbness in her left cheek.  Patient reports this started 2 days ago shortly after taking her first dose of amlodipine.  She denies weakness or headaches.  She reports she stopped taking her amlodipine yesterday and notes her symptoms are significantly improved today.  She has never had this before.  Called her PCP who told her to come to the emergency department to be evaluated.  Currently is feeling well and has no other complaints  Past Medical History:  Diagnosis Date  . HTN (hypertension)     Patient Active Problem List   Diagnosis Date Noted  . Herpes genitalis in women 01/30/2019  . Obesity, morbid, BMI 50 or higher ([place]) 01/30/2019    Past Surgical History:  Procedure Laterality Date  . DILATION AND CURETTAGE OF UTERUS    . HYSTEROSCOPY WITH D & C N/A 02/05/2019   Procedure: DILATATION AND CURETTAGE /HYSTEROSCOPY;  Surgeon: Homero Fellers, MD;  Location: [facility] ORS;  Service: Gynecology;  Laterality: N/A;  . TONSILLECTOMY AND ADENOIDECTOMY      Prior to Admission medications   Medication Sig Start Date End Date Taking? Authorizing Provider  acetaminophen (TYLENOL) 500 MG tablet Take 2 tablets (1,000 mg total) by mouth every 6 (six) hours as needed. Patient not taking: Reported on 02/13/2019 02/05/19 02/05/20  Homero Fellers, MD  hydrochlorothiazide (HYDRODIURIL) 25 MG tablet Take 1 tablet (25 mg total) by mouth daily. 06/07/18   Harvest Dark, MD  ibuprofen (ADVIL) 600 MG tablet Take 1 tablet (600 mg total) by mouth every 6 (six) hours as needed. Patient not taking: Reported on 02/13/2019 02/05/19   Homero Fellers, MD  meloxicam (MOBIC) 15 MG tablet  Take 1 tablet (15 mg total) by mouth daily. 04/07/19   Cuthriell, Charline Bills, PA-C  norethindrone (MICRONOR) 0.35 MG tablet Take 1 tablet (0.35 mg total) by mouth daily. 01/11/19   Schuman, Stefanie Libel, MD  valACYclovir (VALTREX) 1000 MG tablet Take 1,000 mg by mouth 2 (two) times daily as needed.    [provider]     Allergies Patient has no known allergies.  History reviewed. No pertinent family history.  Social History Social History   Tobacco Use  . Smoking status: Former Research scientist (life sciences)  . Smokeless tobacco: Never Used  Substance Use Topics  . Alcohol use: No  . Drug use: Not Currently    Types: Marijuana    Review of Systems  Constitutional: No fever/chills Eyes: No visual changes.  ENT: No sore throat. Cardiovascular: Denies chest pain. Respiratory: Denies shortness of breath. Gastrointestinal: No abdominal pain.   Genitourinary: Negative for dysuria. Musculoskeletal: Negative for back pain. Skin: Negative for rash. Neurological: As above   ____________________________________________   PHYSICAL EXAM:  VITAL SIGNS: ED Triage Vitals [06/18/19 1019]  Enc Vitals Group     BP (!) 148/97     Pulse Rate 99     Resp 16     Temp 98.6 F (37 C)     Temp Source Oral     SpO2 100 %     Weight (!) 142 kg (313 lb)     Height 1.6 m (5\' 3" )     Head Circumference  Peak Flow      Pain Score 0     Pain Loc      Pain Edu?      Excl. in GC?     Constitutional: Alert and oriented. No acute distress. Pleasant and interactive  Nose: No congestion/rhinnorhea. Mouth/Throat: Mucous membranes are moist.    Cardiovascular: Normal rate, regular rhythm.  Good peripheral circulation. Respiratory: Normal respiratory effort.  No retractions. Lungs CTAB. Gastrointestinal: Soft and nontender. No distention.  .  Musculoskeletal: No lower extremity tenderness nor edema.  Warm and well perfused Neurologic:  Normal speech and language. No gross focal neurologic deficits  are appreciated.  Cranial nerves II to XII normal Skin:  Skin is warm, dry and intact. No rash noted. Psychiatric: Mood and affect are normal. Speech and behavior are normal.  ____________________________________________   LABS (all labs ordered are listed, but only abnormal results are displayed)  Labs Reviewed  COMPREHENSIVE METABOLIC PANEL - Abnormal; Notable for the following components:      Result Value   Potassium 3.4 (*)    Glucose, Bld 106 (*)    Total Protein 8.2 (*)    All other components within normal limits  CBC WITH DIFFERENTIAL/PLATELET   ____________________________________________  EKG  ED ECG REPORT I, Jene Every, the attending physician, personally viewed and interpreted this ECG.  Date: 06/18/2019  Rhythm: normal sinus rhythm QRS Axis: normal Intervals: normal ST/T Wave abnormalities: normal Narrative Interpretation: no evidence of acute ischemia  ____________________________________________  RADIOLOGY  None ____________________________________________   PROCEDURES  Procedure(s) performed: No  Procedures   Critical Care performed: No ____________________________________________   INITIAL IMPRESSION / ASSESSMENT AND PLAN / ED COURSE  Pertinent labs & imaging results that were available during my care of the patient were reviewed by me and considered in my medical decision making (see chart for details).  Presents with tingling sensation to her left cheek, now improving after discontinuing amlodipine.  Certainly symptoms could be related to medication side effect especially given improvement after stopping medication.  No neuro deficits, no headache or strokelike symptoms.  Recommends supportive care over the neck several days to see if her symptoms resolve without intervention, if any worsening return to the emergency department.    ____________________________________________   FINAL CLINICAL IMPRESSION(S) / ED DIAGNOSES  Final  diagnoses:  Paresthesia  Medication side effect        Note:  This document was prepared using Dragon voice recognition software and may include unintentional dictation errors.   Jene Every, MD 06/18/19 [phone]

## 2019-10-09 ENCOUNTER — Other Ambulatory Visit: Payer: Self-pay

## 2019-10-09 ENCOUNTER — Encounter: Payer: Self-pay | Admitting: *Deleted

## 2019-10-09 DIAGNOSIS — R2243 Localized swelling, mass and lump, lower limb, bilateral: Secondary | ICD-10-CM | POA: Insufficient documentation

## 2019-10-09 DIAGNOSIS — Z5321 Procedure and treatment not carried out due to patient leaving prior to being seen by health care provider: Secondary | ICD-10-CM | POA: Insufficient documentation

## 2019-10-09 LAB — CBC
HCT: 38.8 % (ref 36.0–46.0)
Hemoglobin: 12.8 g/dL (ref 12.0–15.0)
MCH: 29.1 pg (ref 26.0–34.0)
MCHC: 33 g/dL (ref 30.0–36.0)
MCV: 88.2 fL (ref 80.0–100.0)
Platelets: 286 10*3/uL (ref 150–400)
RBC: 4.4 MIL/uL (ref 3.87–5.11)
RDW: 14.6 % (ref 11.5–15.5)
WBC: 9.8 10*3/uL (ref 4.0–10.5)
nRBC: 0 % (ref 0.0–0.2)

## 2019-10-09 LAB — BASIC METABOLIC PANEL
Anion gap: 7 (ref 5–15)
BUN: 16 mg/dL (ref 6–20)
CO2: 25 mmol/L (ref 22–32)
Calcium: 8.2 mg/dL — ABNORMAL LOW (ref 8.9–10.3)
Chloride: 104 mmol/L (ref 98–111)
Creatinine, Ser: 0.98 mg/dL (ref 0.44–1.00)
GFR calc Af Amer: >60 mL/min (ref >60)
GFR calc non Af Amer: >60 mL/min (ref >60)
Glucose, Bld: 128 mg/dL — ABNORMAL HIGH (ref 70–99)
Potassium: 3.6 mmol/L (ref 3.5–5.1)
Sodium: 136 mmol/L (ref 135–145)

## 2019-10-09 NOTE — ED Triage Notes (Signed)
Pt ambulatory to triage.  Pt reports swelling to both feet and lower legs for 3 weeks.  No sob.  Pt alert speech clear.

## 2019-10-10 ENCOUNTER — Emergency Department
Admission: EM | Admit: 2019-10-10 | Discharge: 2019-10-10 | Disposition: A | Discharge status: Home / Self Care | Payer: Medicaid Other | Attending: Emergency Medicine | Admitting: Emergency Medicine

## 2019-10-23 NOTE — Telephone Encounter (Signed)
Patient is schedule for Monday, 10/28/19 with [facility] for mirena placement

## 2019-10-24 NOTE — Telephone Encounter (Signed)
Noted. Mirena reserved for this patient. 

## 2019-10-25 NOTE — Progress Notes (Signed)
    [employer], Engineer, manufacturing Complaint  Patient presents with  . Contraception    Mirena insertion    HPI:      [name] is a 34 y.o. G1P1001 whose LMP was Patient's last menstrual period was 10/14/2019 (approximate)., presents today for Mirena insertion. Hx of  AUB 9/20 with polyps on u/s. S/p hysteroscopy with D&C, neg pathology, 10/20 with Dr. Jerene Pitch. Discussed IUD as tx option and pt here for insertion, referred by PCP. Amenorrheic since 10/20 D&C until 2 wks ago. Has had heavy bleeding, soiling clothes through 2-3 pads and with quarter sized clots. Bleeding is slowing down now.  She is sex active, no dyspareunia/bleeding.   Past Medical History:  Diagnosis Date  . HTN (hypertension)     Past Surgical History:  Procedure Laterality Date  . DILATION AND CURETTAGE OF UTERUS    . HYSTEROSCOPY WITH D & C N/A 02/05/2019   Procedure: DILATATION AND CURETTAGE /HYSTEROSCOPY;  Surgeon: Natale Milch, MD;  Location: [facility] ORS;  Service: Gynecology;  Laterality: N/A;  . TONSILLECTOMY AND ADENOIDECTOMY      History reviewed. No pertinent family history.   IUD Insertion Procedure Note Patient identified, informed consent performed, consent signed.   Discussed risks of irregular bleeding, cramping, infection, malpositioning or misplacement of the IUD outside the uterus which may require further procedure such as laparoscopy, risk of failure <1%. Time out was performed.  Urine pregnancy test negative.  Speculum placed in the vagina.  Cervix visualized.  Cleaned with Betadine x 2.  Grasped anteriorly with a single tooth tenaculum.  Uterus sounded to 9.0 cm.   IUD placed per manufacturer's recommendations.  Strings trimmed to 3 cm. Tenaculum was removed, good hemostasis noted.  Patient tolerated procedure well.   ASSESSMENT:  Encounter for insertion of intrauterine contraceptive device (IUD) - Plan: POCT urine pregnancy, levonorgestrel (MIRENA, 52 MG,) 20  MCG/24HR IUD  Irregular menses - Plan: levonorgestrel (MIRENA, 52 MG,) 20 MCG/24HR IUD   Meds ordered this encounter  Medications  . levonorgestrel (MIRENA, 52 MG,) 20 MCG/24HR IUD    Sig: 1 Intra Uterine Device (1 each total) by Intrauterine route once for 1 dose.    Dispense:  1 Intra Uterine Device    Refill:  0    Order Specific Question:   Supervising Provider    Answer:   Nadara Mustard [[id number]]     Plan:  Patient was given post-procedure instructions.  She was advised to have backup contraception for one week.   Call if you are having increasing pain, cramps or bleeding or if you have a fever greater than 100.4 degrees F., shaking chills, nausea or vomiting. Patient was also asked to check IUD strings periodically and follow up in 4 weeks for IUD check.  Return in about 4 weeks (around 11/25/2019) for IUD f/u.  Kholton Coate B. Alise Calais, PA-C 10/28/2019 10:40 AM

## 2019-10-28 ENCOUNTER — Encounter: Payer: Self-pay | Admitting: Obstetrics and Gynecology

## 2019-10-28 ENCOUNTER — Other Ambulatory Visit: Payer: Self-pay

## 2019-10-28 ENCOUNTER — Ambulatory Visit (INDEPENDENT_AMBULATORY_CARE_PROVIDER_SITE_OTHER): Payer: Medicaid Other | Admitting: Obstetrics and Gynecology

## 2019-10-28 VITALS — BP 140/90 | Ht 63.0 in | Wt 303.0 lb

## 2019-10-28 DIAGNOSIS — Z30430 Encounter for insertion of intrauterine contraceptive device: Secondary | ICD-10-CM

## 2019-10-28 DIAGNOSIS — N926 Irregular menstruation, unspecified: Secondary | ICD-10-CM

## 2019-10-28 DIAGNOSIS — Z30014 Encounter for initial prescription of intrauterine contraceptive device: Secondary | ICD-10-CM

## 2019-10-28 DIAGNOSIS — Z3202 Encounter for pregnancy test, result negative: Secondary | ICD-10-CM

## 2019-10-28 LAB — POCT URINE PREGNANCY: Preg Test, Ur: NEGATIVE

## 2019-10-28 MED ORDER — MIRENA (52 MG) 20 MCG/24HR IU IUD: 1.0000 | INTRAUTERINE_SYSTEM | Freq: Once | INTRAUTERINE | 0 refills | Status: AC

## 2019-10-28 NOTE — Patient Instructions (Signed)
I value your feedback and entrusting us with your care. If you get a [facility] patient survey, I would appreciate you taking the time to let us know about your experience today. Thank you!  As of April 18, 2019, your lab results will be released to your MyChart immediately, before I even have a chance to see them. Please give me time to review them and contact you if there are any abnormalities. Thank you for your patience.   [facility] [phone]  Instructions after IUD insertion  Most women experience no significant problems after insertion of an IUD, however minor cramping and spotting for a few days is common. Cramps may be treated with ibuprofen 800mg every 8 hours or Tylenol 650 mg every 4 hours. Contact [facility] immediately if you experience any of the following symptoms during the next week: temperature >99.6 degrees, worsening pelvic pain, abdominal pain, fainting, unusually heavy vaginal bleeding, foul vaginal discharge, or if you think you have expelled the IUD.  Nothing inserted in the vagina for 48 hours. You will be scheduled for a follow up visit in approximately four weeks.  You should check monthly to be sure you can feel the IUD strings in the upper vagina. If you are having a monthly period, try to check after each period. If you cannot feel the IUD strings,  contact [facility] immediately so we can do an exam to determine if the IUD has been expelled.   Please use backup protection until we can confirm the IUD is in place.  Call [facility] if you are exposed to or diagnosed with a sexually transmitted infection, as we will need to discuss whether it is safe for you to continue using an IUD.   

## 2019-11-01 ENCOUNTER — Ambulatory Visit: Payer: Medicaid Other | Attending: Internal Medicine

## 2019-11-01 DIAGNOSIS — Z20822 Contact with and (suspected) exposure to covid-19: Secondary | ICD-10-CM

## 2019-11-02 ENCOUNTER — Encounter: Payer: Self-pay | Admitting: Emergency Medicine

## 2019-11-02 ENCOUNTER — Emergency Department
Admission: EM | Admit: 2019-11-02 | Discharge: 2019-11-02 | Disposition: A | Discharge status: Home / Self Care | Payer: Medicaid Other | Attending: Emergency Medicine | Admitting: Emergency Medicine

## 2019-11-02 ENCOUNTER — Other Ambulatory Visit: Payer: Self-pay

## 2019-11-02 DIAGNOSIS — F1290 Cannabis use, unspecified, uncomplicated: Secondary | ICD-10-CM | POA: Diagnosis not present

## 2019-11-02 DIAGNOSIS — Z79899 Other long term (current) drug therapy: Secondary | ICD-10-CM | POA: Diagnosis not present

## 2019-11-02 DIAGNOSIS — Z87891 Personal history of nicotine dependence: Secondary | ICD-10-CM | POA: Diagnosis not present

## 2019-11-02 DIAGNOSIS — I10 Essential (primary) hypertension: Secondary | ICD-10-CM | POA: Insufficient documentation

## 2019-11-02 DIAGNOSIS — J3489 Other specified disorders of nose and nasal sinuses: Secondary | ICD-10-CM | POA: Insufficient documentation

## 2019-11-02 DIAGNOSIS — R05 Cough: Secondary | ICD-10-CM | POA: Insufficient documentation

## 2019-11-02 DIAGNOSIS — R432 Parageusia: Secondary | ICD-10-CM | POA: Insufficient documentation

## 2019-11-02 DIAGNOSIS — R430 Anosmia: Secondary | ICD-10-CM | POA: Insufficient documentation

## 2019-11-02 DIAGNOSIS — J069 Acute upper respiratory infection, unspecified: Secondary | ICD-10-CM

## 2019-11-02 DIAGNOSIS — R0981 Nasal congestion: Secondary | ICD-10-CM | POA: Diagnosis present

## 2019-11-02 LAB — NOVEL CORONAVIRUS, NAA: SARS-CoV-2, NAA: NOT DETECTED

## 2019-11-02 LAB — SARS-COV-2, NAA 2 DAY TAT

## 2019-11-02 MED ORDER — FLUTICASONE PROPIONATE 50 MCG/ACT NA SUSP: 2.0000 | Freq: Every day | NASAL | 0 refills | Status: AC

## 2019-11-02 MED ORDER — BENZONATATE 100 MG PO CAPS: 100.0000 mg | ORAL_CAPSULE | Freq: Three times a day (TID) | ORAL | 0 refills | Status: DC | PRN

## 2019-11-02 NOTE — ED Triage Notes (Signed)
Pt here for nasal congestion and loss taste. Not around anyone sick that she knows of.  No pain.

## 2019-11-02 NOTE — ED Notes (Signed)
See triage note    Presents with nasal congestion for couple of days  Woke up with slight cough  No fever  States she had taken a COVID test yesterday  She is waiting on results

## 2019-11-02 NOTE — ED Provider Notes (Signed)
[facility] Emergency Department Provider Note  ____________________________________________  Time seen: Approximately 10:43 AM  I have reviewed the triage vital signs and the nursing notes.   HISTORY  Chief Complaint Nasal Congestion and loss taste    HPI Amber Burnett is a 34 y.o. female that presents to the emergency department for evaluation of nasal congestion for 3 days and cough this morning.  She has a slight loss of smell today.  Patient was tested for Covid yesterday but has not received the results yet.  No sick contacts.  She did not receive a Covid vaccine.  She is unsure whether she should go to work today.  Past Medical History:  Diagnosis Date  . HTN (hypertension)     Patient Active Problem List   Diagnosis Date Noted  . Herpes genitalis in women 01/30/2019  . Obesity, morbid, BMI 50 or higher (HCC) 01/30/2019    Past Surgical History:  Procedure Laterality Date  . DILATION AND CURETTAGE OF UTERUS    . HYSTEROSCOPY WITH D & C N/A 02/05/2019   Procedure: DILATATION AND CURETTAGE /HYSTEROSCOPY;  Surgeon: Natale Milch, MD;  Location: [facility] ORS;  Service: Gynecology;  Laterality: N/A;  . TONSILLECTOMY AND ADENOIDECTOMY      Prior to Admission medications   Medication Sig Start Date End Date Taking? Authorizing Provider  benzonatate (TESSALON PERLES) 100 MG capsule Take 1 capsule (100 mg total) by mouth 3 (three) times daily as needed. 11/02/19 11/01/20  Enid Derry, PA-C  fluticasone (FLONASE) 50 MCG/ACT nasal spray Place 2 sprays into both nostrils daily. 11/02/19 11/01/20  Enid Derry, PA-C  hydrochlorothiazide (HYDRODIURIL) 25 MG tablet Take 1 tablet (25 mg total) by mouth daily. 06/07/18   Minna Antis, MD  levonorgestrel (MIRENA, 52 MG,) 20 MCG/24HR IUD 1 Intra Uterine Device (1 each total) by Intrauterine route once for 1 dose. 10/28/19 10/28/19  Copland, Ilona Sorrel, PA-C    Allergies Patient has no known  allergies.  History reviewed. No pertinent family history.  Social History Social History   Tobacco Use  . Smoking status: Former Games developer  . Smokeless tobacco: Never Used  Vaping Use  . Vaping Use: Never used  Substance Use Topics  . Alcohol use: No  . Drug use: Not Currently    Types: Marijuana     Review of Systems  Constitutional: No fever/chills Eyes: No visual changes. No discharge. ENT: Positive for congestion and rhinorrhea. Cardiovascular: No chest pain. Respiratory: Positive for cough. No SOB. Gastrointestinal: No abdominal pain.  No nausea, no vomiting.  No diarrhea.  No constipation. Musculoskeletal: Negative for musculoskeletal pain. Skin: Negative for rash, abrasions, lacerations, ecchymosis. Neurological: Negative for headaches.   ____________________________________________   PHYSICAL EXAM:  VITAL SIGNS: ED Triage Vitals  Enc Vitals Group     BP 11/02/19 1026 (!) 151/99     Pulse Rate 11/02/19 1026 (!) 108     Resp 11/02/19 1026 18     Temp 11/02/19 1026 98.2 F (36.8 C)     Temp Source 11/02/19 1026 Oral     SpO2 11/02/19 1026 100 %     Weight 11/02/19 1021 (!) 303 lb (137.4 kg)     Height 11/02/19 1021 5\' 3"  (1.6 m)     Head Circumference --      Peak Flow --      Pain Score 11/02/19 1021 0     Pain Loc --      Pain Edu? --  Excl. in [place]? --      Constitutional: Alert and oriented. Well appearing and in no acute distress. Eyes: Conjunctivae are normal. PERRL. EOMI. No discharge. Head: Atraumatic. ENT: No frontal and maxillary sinus tenderness.      Ears: Tympanic membranes pearly gray with good landmarks. No discharge.      Nose: Mild congestion/rhinnorhea.      Mouth/Throat: Mucous membranes are moist. Oropharynx non-erythematous. Tonsils not enlarged. No exudates. Uvula midline. Neck: No stridor.   Hematological/Lymphatic/Immunilogical: No cervical lymphadenopathy. Cardiovascular: Normal rate, regular rhythm.  Good peripheral  circulation. Respiratory: Normal respiratory effort without tachypnea or retractions. Lungs CTAB. Good air entry to the bases with no decreased or absent breath sounds. Gastrointestinal: Bowel sounds 4 quadrants. Soft and nontender to palpation. No guarding or rigidity. No palpable masses. No distention. Musculoskeletal: Full range of motion to all extremities. No gross deformities appreciated. Neurologic:  Normal speech and language. No gross focal neurologic deficits are appreciated.  Skin:  Skin is warm, dry and intact. No rash noted. Psychiatric: Mood and affect are normal. Speech and behavior are normal. Patient exhibits appropriate insight and judgement.   ____________________________________________   LABS (all labs ordered are listed, but only abnormal results are displayed)  Labs Reviewed - No data to display ____________________________________________  EKG   ____________________________________________  RADIOLOGY   No results found.  ____________________________________________    PROCEDURES  Procedure(s) performed:    Procedures    Medications - No data to display   ____________________________________________   INITIAL IMPRESSION / ASSESSMENT AND PLAN / ED COURSE  Pertinent labs & imaging results that were available during my care of the patient were reviewed by me and considered in my medical decision making (see chart for details).  Review of the [place] CSRS was performed in accordance of the [place] prior to dispensing any controlled drugs.     Patient's diagnosis is consistent with viral URI. Vital signs and exam are reassuring.  Patient is awaiting Covid results.  No indication for imaging at this time.  Patient appears well and is staying well hydrated. Patient should alternate tylenol and ibuprofen for fever. Patient feels comfortable going home. Patient will be discharged home with prescriptions for Flonase and Tessalon Perles. Patient is to  follow up with primary care as needed or otherwise directed. Patient is given ED precautions to return to the ED for any worsening or new symptoms.  Amber Burnett was evaluated in Emergency Department on 11/02/2019 for the symptoms described in the history of present illness. She was evaluated in the context of the global COVID-19 pandemic, which necessitated consideration that the patient might be at risk for infection with the SARS-CoV-2 virus that causes COVID-19. Institutional protocols and algorithms that pertain to the evaluation of patients at risk for COVID-19 are in a state of rapid change based on information released by regulatory bodies including the CDC and federal and state organizations. These policies and algorithms were followed during the patient's care in the ED.   ____________________________________________  FINAL CLINICAL IMPRESSION(S) / ED DIAGNOSES  Final diagnoses:  Viral URI with cough      NEW MEDICATIONS STARTED DURING THIS VISIT:  ED Discharge Orders         Ordered    benzonatate (TESSALON PERLES) 100 MG capsule  3 times daily PRN     Discontinue  Reprint     11/02/19 1045    fluticasone (FLONASE) 50 MCG/ACT nasal spray  Daily     Discontinue  Reprint  11/02/19 1045              This chart was dictated using voice recognition software/Dragon. Despite best efforts to proofread, errors can occur which can change the meaning. Any change was purely unintentional.    Enid Derry, PA-C 11/02/19 1230    Sharman Cheek, MD 11/02/19 1535

## 2019-11-25 ENCOUNTER — Ambulatory Visit: Payer: Medicaid Other | Admitting: Obstetrics and Gynecology

## 2019-11-25 NOTE — Progress Notes (Deleted)
   No chief complaint on file.    History of Present Illness:  Amber Burnett is a 34 y.o. that had a Mirena IUD placed approximately 1 month ago for AUB/heavy bleeding. Since that time, she denies dyspareunia, pelvic pain, non-menstrual bleeding, vaginal d/c, heavy bleeding.   Review of Systems  Physical Exam:  There were no vitals taken for this visit. There is no height or weight on file to calculate BMI.  Pelvic exam:  Two IUD strings {DESC; PRESENT/ABSENT:17923::"present"} seen coming from the cervical os. EGBUS, vaginal vault and cervix: within normal limits   Assessment:   No diagnosis found.  IUD strings present in proper location; pt doing well  Plan: F/u if any signs of infection or can no longer feel the strings.   Lisamarie Coke B. Kylie Gros, PA-C 11/25/2019 11:38 AM

## 2019-12-08 NOTE — Progress Notes (Deleted)
° °  No chief complaint on file.    History of Present Illness:  Amber Burnett is a 34 y.o. that had a Mirena IUD placed approximately 1 {Weeks/Months:20313} ago. Since that time, she denies dyspareunia, pelvic pain, non-menstrual bleeding, vaginal d/c, heavy bleeding.   Review of Systems  Physical Exam:  There were no vitals taken for this visit. There is no height or weight on file to calculate BMI.  Pelvic exam:  Two IUD strings {DESC; PRESENT/ABSENT:17923::"present"} seen coming from the cervical os. EGBUS, vaginal vault and cervix: within normal limits   Assessment:   No diagnosis found.  IUD strings present in proper location; pt doing well  Plan: F/u if any signs of infection or can no longer feel the strings.   Amber Oommen B. Carolin Quang, PA-C 12/08/2019 9:03 PM

## 2019-12-09 ENCOUNTER — Ambulatory Visit: Payer: Medicaid Other | Admitting: Obstetrics and Gynecology

## 2019-12-11 ENCOUNTER — Other Ambulatory Visit: Payer: Self-pay

## 2019-12-11 ENCOUNTER — Emergency Department
Admission: EM | Admit: 2019-12-11 | Discharge: 2019-12-11 | Disposition: A | Discharge status: Home / Self Care | Payer: Medicaid Other | Attending: Emergency Medicine | Admitting: Emergency Medicine

## 2019-12-11 DIAGNOSIS — Z79899 Other long term (current) drug therapy: Secondary | ICD-10-CM | POA: Diagnosis not present

## 2019-12-11 DIAGNOSIS — K0889 Other specified disorders of teeth and supporting structures: Secondary | ICD-10-CM | POA: Diagnosis not present

## 2019-12-11 DIAGNOSIS — Z87891 Personal history of nicotine dependence: Secondary | ICD-10-CM | POA: Insufficient documentation

## 2019-12-11 DIAGNOSIS — I10 Essential (primary) hypertension: Secondary | ICD-10-CM | POA: Diagnosis not present

## 2019-12-11 MED ORDER — LIDOCAINE VISCOUS HCL 2 % MT SOLN
15.0000 mL | Freq: Once | OROMUCOSAL | Status: AC
  Administered 2019-12-11: 15 mL via OROMUCOSAL
  Filled 2019-12-11: qty 15

## 2019-12-11 MED ORDER — AMOXICILLIN 500 MG PO CAPS
500.0000 mg | ORAL_CAPSULE | Freq: Once | ORAL | Status: AC
  Administered 2019-12-11: 500 mg via ORAL
  Filled 2019-12-11: qty 1

## 2019-12-11 MED ORDER — AMOXICILLIN 500 MG PO CAPS: 500.0000 mg | ORAL_CAPSULE | Freq: Three times a day (TID) | ORAL | 0 refills | Status: DC

## 2019-12-11 MED ORDER — LIDOCAINE VISCOUS HCL 2 % MT SOLN: 10.0000 mL | OROMUCOSAL | 0 refills | Status: AC | PRN

## 2019-12-11 NOTE — Discharge Instructions (Signed)
OPTIONS FOR DENTAL FOLLOW UP CARE ° °[place] Department of Health and Human Services - Local Safety Net Dental Clinics °http://www.ncdhhs.gov/dph/oralhealth/services/safetynetclinics.htm °  °[facility] ([phone]) ° °[facility] ([phone]) ° °[facility] ([phone] ext [phone]) ° °[facility] ([phone]) ° °[facility] ([phone]) °This clinic caters to the indigent population and is on a lottery system. °Location: °[facility], [address], [place] °Clinic Hours: °Wednesdays from 6pm - 9pm, patients seen by a lottery system. °For dates, call or go to [email or web address].[facility].edu/[facility]/patients/Dental-[facility] °Services: °Cleanings, fillings and simple extractions. °Payment Options: °DENTAL WORK IS FREE OF CHARGE. Bring proof of income or support. °Best way to get seen: °Arrive at 5:15 pm - this is a lottery, NOT first come/first serve, so arriving earlier will not increase your chances of being seen. °  °  °[facility] °[phone] °Select option 1 for emergencies °  °Location: °[facility], [address], [place] °Clinic Hours: °No walk-ins accepted - call the day before to schedule an appointment. °Check in times are 9:30 am and 1:30 pm. °Services: °Simple extractions, temporary fillings, pulpectomy/pulp debridement, uncomplicated abscess drainage. °Payment Options: °PAYMENT IS DUE AT THE TIME OF SERVICE.  Fee is usually $100-200, additional surgical procedures (e.g. abscess drainage) may be extra. °Cash, checks, Visa/MasterCard accepted.  Can file Medicaid if patient is covered for dental - patient should call case worker to check. °No discount for [facility] patients. °Best way to get seen: °MUST call the day before and get onto the schedule. Can usually be seen the next 1-2 days. No walk-ins accepted. °  °  °[facility] °[phone] °   °Location: °[facility], [address], [place] °Clinic Hours: °M, W, Th, F 8am or 1:30pm, Tues 9a or 1:30 - first come/first served. °Services: °Simple extractions, temporary fillings, uncomplicated abscess drainage.  You do not need to be an [place] resident. °Payment Options: °PAYMENT IS DUE AT THE TIME OF SERVICE. °Dental insurance, otherwise sliding scale - bring proof of income or support. °Depending on income and treatment needed, cost is usually $50-200. °Best way to get seen: °Arrive early as it is first come/first served. °  °  °[facility] °[phone] °  °Location: °[address] °Clinic Hours: °Mon-Thu 8a-5p °Services: °Most basic dental services including extractions and fillings. °Payment Options: °PAYMENT IS DUE AT THE TIME OF SERVICE. °Sliding scale, up to 50% off - bring proof if income or support. °Medicaid with dental option accepted. °Best way to get seen: °Call to schedule an appointment, can usually be seen within 2 weeks OR they will try to see walk-ins - show up at 8a or 2p (you may have to wait). °  °  °[facility] °[phone] °[place] RESIDENTS ONLY °  °Location: °[facility], [address], [place], [place] [address] °Clinic Hours: By appointment only. °Monday - Thursday 8am-5pm, Friday 8am-12pm °Services: Cleanings, fillings, extractions. °Payment Options: °PAYMENT IS DUE AT THE TIME OF SERVICE. °Cash, Visa or MasterCard. Sliding scale - $30 minimum per service. °Best way to get seen: °Come in to office, complete packet and make an appointment - need proof of income °or support monies for each household member and proof of [place] residence. °Usually takes about a month to get in. °  °  °[facility] °[phone] °  °Location: °[address]   [place] °Clinic Hours: Walk-in Urgent Care Dental Services are offered Monday-Friday  mornings only. °The numbers of emergencies accepted daily is limited to the number of °providers available. °Maximum 15 - Mondays, Wednesdays & Thursdays °Maximum 10 - Tuesdays & Fridays °Services: °You do not need to be a [place] resident to be seen for a dental emergency. °Emergencies are defined as pain, swelling, abnormal bleeding, or dental trauma. Walkins will receive x-rays if needed. °NOTE: Dental cleaning is not an emergency. °Payment Options: °PAYMENT IS DUE AT THE TIME OF SERVICE. °Minimum co-pay is $40.00 for uninsured patients. °Minimum co-pay is $3.00 for Medicaid with dental coverage. °Dental Insurance is accepted and must be presented at time of visit. °Medicare does not cover dental. °Forms of payment: Cash, credit card, checks. °Best way to get seen: °If not previously registered with the clinic, walk-in dental registration begins at 7:15 am and is on a first come/first serve basis. °If previously registered with the clinic, call to make an appointment. °  °  °[facility] °[phone] °[place] RESIDENTS ONLY °  °Location: °[address], [place], [place] °Clinic Hours: °Mon-Thu 10a-2p °Services: Extractions only! °Payment Options: °FREE (donations accepted) - bring proof of income or support °Best way to get seen: °Call and schedule an appointment OR come at 8am on the 1st Monday of every month (except for holidays) when it is first come/first served. °  °  °[facility] °[phone] °  °Location: °[address], [place] °Clinic Hours: °Friday mornings °Services, Payment Options, Best way to get seen: °Call for info °

## 2019-12-11 NOTE — ED Triage Notes (Signed)
Pt arrives to ed via pov with c/o right sided dental pain that started Saturday. Pt states she felt right side of face was swollen yesterday, but on assessment today is not obviously swollen. NAD at this time

## 2019-12-11 NOTE — ED Notes (Signed)
See triage note   Presents with some right sided dental pain  States pain started about 3 days ago  Swelling noted to face

## 2019-12-11 NOTE — ED Provider Notes (Signed)
[facility] Emergency Department Provider Note  ____________________________________________  Time seen: Approximately 3:20 PM  I have reviewed the triage vital signs and the nursing notes.   HISTORY  Chief Complaint Dental Pain    HPI Amber Burnett is a 34 y.o. female that presents to the emergency department for evaluation of right-sided bottom gum and dental pain for 3 days.  Patient states that the bottom of her gum is sore.  She did make a dentist appointment for Monday.  She has not had any fevers.  She had some swelling that started yesterday.  No dry mouth or excessive salivating.  No difficulty opening or closing mouth.  No fevers.   Past Medical History:  Diagnosis Date  . HTN (hypertension)     Patient Active Problem List   Diagnosis Date Noted  . Herpes genitalis in women 01/30/2019  . Obesity, morbid, BMI 50 or higher (HCC) 01/30/2019    Past Surgical History:  Procedure Laterality Date  . DILATION AND CURETTAGE OF UTERUS    . HYSTEROSCOPY WITH D & C N/A 02/05/2019   Procedure: DILATATION AND CURETTAGE /HYSTEROSCOPY;  Surgeon: Natale Milch, MD;  Location: [facility] ORS;  Service: Gynecology;  Laterality: N/A;  . TONSILLECTOMY AND ADENOIDECTOMY      Prior to Admission medications   Medication Sig Start Date End Date Taking? Authorizing Provider  amoxicillin (AMOXIL) 500 MG capsule Take 1 capsule (500 mg total) by mouth 3 (three) times daily. 12/11/19   Enid Derry, PA-C  fluticasone (FLONASE) 50 MCG/ACT nasal spray Place 2 sprays into both nostrils daily. 11/02/19 11/01/20  Enid Derry, PA-C  hydrochlorothiazide (HYDRODIURIL) 25 MG tablet Take 1 tablet (25 mg total) by mouth daily. 06/07/18   Minna Antis, MD  levonorgestrel (MIRENA, 52 MG,) 20 MCG/24HR IUD 1 Intra Uterine Device (1 each total) by Intrauterine route once for 1 dose. 10/28/19 10/28/19  Copland, Helmut Muster B, PA-C  lidocaine (XYLOCAINE) 2 % solution Use as directed  10 mLs in the mouth or throat as needed. 12/11/19   Enid Derry, PA-C    Allergies Patient has no known allergies.  History reviewed. No pertinent family history.  Social History Social History   Tobacco Use  . Smoking status: Former Games developer  . Smokeless tobacco: Never Used  Vaping Use  . Vaping Use: Never used  Substance Use Topics  . Alcohol use: No  . Drug use: Not Currently    Types: Marijuana     Review of Systems  Constitutional: No fever/chills ENT: No upper respiratory complaints. Respiratory: No SOB. Gastrointestinal: No abdominal pain.  No nausea, no vomiting.  Musculoskeletal: Negative for musculoskeletal pain. Skin: Negative for rash, abrasions, lacerations, ecchymosis. Neurological: Negative for headaches   ____________________________________________   PHYSICAL EXAM:  VITAL SIGNS: ED Triage Vitals [12/11/19 1501]  Enc Vitals Group     BP (!) 135/104     Pulse Rate 84     Resp 18     Temp 98.7 F (37.1 C)     Temp Source Oral     SpO2 100 %     Weight      Height      Head Circumference      Peak Flow      Pain Score 8     Pain Loc      Pain Edu?      Excl. in GC?      Constitutional: Alert and oriented. Well appearing and in no acute distress. Eyes: Conjunctivae  are normal. PERRL. EOMI. Head: Atraumatic. ENT:      Ears:      Nose: No congestion/rhinnorhea.      Mouth/Throat: Mucous membranes are moist.  Tenderness to palpation to right posterior gum.  Mild swelling to right cheek.  No submandibular swelling.  Full range of motion of jaw. Neck: No stridor. Cardiovascular: Normal rate, regular rhythm.  Good peripheral circulation. Respiratory: Normal respiratory effort without tachypnea or retractions. Lungs CTAB. Good air entry to the bases with no decreased or absent breath sounds. Musculoskeletal: Full range of motion to all extremities. No gross deformities appreciated. Neurologic:  Normal speech and language. No gross focal  neurologic deficits are appreciated.  Skin:  Skin is warm, dry and intact. No rash noted. Psychiatric: Mood and affect are normal. Speech and behavior are normal. Patient exhibits appropriate insight and judgement.   ____________________________________________   LABS (all labs ordered are listed, but only abnormal results are displayed)  Labs Reviewed - No data to display ____________________________________________  EKG   ____________________________________________  RADIOLOGY  No results found.  ____________________________________________    PROCEDURES  Procedure(s) performed:    Procedures    Medications  lidocaine (XYLOCAINE) 2 % viscous mouth solution 15 mL (15 mLs Mouth/Throat Given 12/11/19 1600)  amoxicillin (AMOXIL) capsule 500 mg (500 mg Oral Given 12/11/19 1600)     ____________________________________________   INITIAL IMPRESSION / ASSESSMENT AND PLAN / ED COURSE  Pertinent labs & imaging results that were available during my care of the patient were reviewed by me and considered in my medical decision making (see chart for details).  Review of the [place] CSRS was performed in accordance of the [facility] prior to dispensing any controlled drugs.     Patient presented to emergency department for evaluation of dental pain.  Vital signs and exam are reassuring.  Patient will be covered for infection.  She has an appointment with dentist on Monday.  Patient was given a dose of amoxicillin in the emergency department.  Patient will be discharged home with prescriptions for amoxicillin.  Patient is to follow up with dentist as directed. Patient is given ED precautions to return to the ED for any worsening or new symptoms.  Amber Burnett was evaluated in Emergency Department on 12/11/2019 for the symptoms described in the history of present illness. She was evaluated in the context of the global COVID-19 pandemic, which necessitated consideration that the patient  might be at risk for infection with the SARS-CoV-2 virus that causes COVID-19. Institutional protocols and algorithms that pertain to the evaluation of patients at risk for COVID-19 are in a state of rapid change based on information released by regulatory bodies including the CDC and federal and state organizations. These policies and algorithms were followed during the patient's care in the ED.   ____________________________________________  FINAL CLINICAL IMPRESSION(S) / ED DIAGNOSES  Final diagnoses:  Pain, dental      NEW MEDICATIONS STARTED DURING THIS VISIT:  ED Discharge Orders         Ordered    amoxicillin (AMOXIL) 500 MG capsule  3 times daily     Discontinue  Reprint     12/11/19 1555    lidocaine (XYLOCAINE) 2 % solution  As needed     Discontinue  Reprint     12/11/19 1555              This chart was dictated using voice recognition software/Dragon. Despite best efforts to proofread, errors can occur which can  change the meaning. Any change was purely unintentional.    Enid Derry, PA-C 12/11/19 2308    Gilles Chiquito, MD 12/11/19 [phone]

## 2019-12-14 ENCOUNTER — Emergency Department
Admission: EM | Admit: 2019-12-14 | Discharge: 2019-12-14 | Disposition: A | Discharge status: Home / Self Care | Payer: Medicaid Other | Attending: Emergency Medicine | Admitting: Emergency Medicine

## 2019-12-14 ENCOUNTER — Other Ambulatory Visit: Payer: Self-pay

## 2019-12-14 ENCOUNTER — Encounter: Payer: Self-pay | Admitting: Emergency Medicine

## 2019-12-14 DIAGNOSIS — I10 Essential (primary) hypertension: Secondary | ICD-10-CM | POA: Diagnosis not present

## 2019-12-14 DIAGNOSIS — K0889 Other specified disorders of teeth and supporting structures: Secondary | ICD-10-CM | POA: Diagnosis not present

## 2019-12-14 DIAGNOSIS — Z87891 Personal history of nicotine dependence: Secondary | ICD-10-CM | POA: Diagnosis not present

## 2019-12-14 MED ORDER — IBUPROFEN 600 MG PO TABS: 600.0000 mg | ORAL_TABLET | Freq: Three times a day (TID) | ORAL | 0 refills | Status: AC | PRN

## 2019-12-14 MED ORDER — OXYCODONE-ACETAMINOPHEN 5-325 MG PO TABS: 1.0000 | ORAL_TABLET | Freq: Four times a day (QID) | ORAL | 0 refills | Status: AC | PRN

## 2019-12-14 MED ORDER — OXYCODONE-ACETAMINOPHEN 5-325 MG PO TABS
1.0000 | ORAL_TABLET | Freq: Once | ORAL | Status: AC
  Administered 2019-12-14: 1 via ORAL
  Filled 2019-12-14: qty 1

## 2019-12-14 NOTE — ED Provider Notes (Signed)
[facility] Emergency Department Provider Note   ____________________________________________   First MD Initiated Contact with Patient 12/14/19 1317     (approximate)  I have reviewed the triage vital signs and the nursing notes.   HISTORY  Chief Complaint Dental Pain    HPI Amber Burnett is a 34 y.o. female patient returns for reevaluation of dental pain from visit on 12/11/2019.  Patient that she has scheduled an appointment for 12/16/2019.  Patient state continue pain Foley transrelief using viscous lidocaine.  Patient rates pain as a 10/10.  Patient described pain is "achy".         Past Medical History:  Diagnosis Date  . HTN (hypertension)     Patient Active Problem List   Diagnosis Date Noted  . Herpes genitalis in women 01/30/2019  . Obesity, morbid, BMI 50 or higher (HCC) 01/30/2019    Past Surgical History:  Procedure Laterality Date  . DILATION AND CURETTAGE OF UTERUS    . HYSTEROSCOPY WITH D & C N/A 02/05/2019   Procedure: DILATATION AND CURETTAGE /HYSTEROSCOPY;  Surgeon: Natale Milch, MD;  Location: [facility] ORS;  Service: Gynecology;  Laterality: N/A;  . TONSILLECTOMY AND ADENOIDECTOMY      Prior to Admission medications   Medication Sig Start Date End Date Taking? Authorizing Provider  amoxicillin (AMOXIL) 500 MG capsule Take 1 capsule (500 mg total) by mouth 3 (three) times daily. 12/11/19   Enid Derry, PA-C  fluticasone (FLONASE) 50 MCG/ACT nasal spray Place 2 sprays into both nostrils daily. 11/02/19 11/01/20  Enid Derry, PA-C  hydrochlorothiazide (HYDRODIURIL) 25 MG tablet Take 1 tablet (25 mg total) by mouth daily. 06/07/18   Minna Antis, MD  ibuprofen (ADVIL) 600 MG tablet Take 1 tablet (600 mg total) by mouth every 8 (eight) hours as needed. 12/14/19   Joni Reining, PA-C  levonorgestrel (MIRENA, 52 MG,) 20 MCG/24HR IUD 1 Intra Uterine Device (1 each total) by Intrauterine route once for 1 dose. 10/28/19  10/28/19  Copland, Helmut Muster B, PA-C  lidocaine (XYLOCAINE) 2 % solution Use as directed 10 mLs in the mouth or throat as needed. 12/11/19   Enid Derry, PA-C  oxyCODONE-acetaminophen (PERCOCET) 5-325 MG tablet Take 1 tablet by mouth every 6 (six) hours as needed for up to 5 days for severe pain. 12/14/19 12/19/19  Joni Reining, PA-C    Allergies Patient has no known allergies.  No family history on file.  Social History Social History   Tobacco Use  . Smoking status: Former Games developer  . Smokeless tobacco: Never Used  Vaping Use  . Vaping Use: Never used  Substance Use Topics  . Alcohol use: No  . Drug use: Not Currently    Types: Marijuana    Review of Systems  Constitutional: No fever/chills Eyes: No visual changes. ENT: No sore throat.  Dental pain Cardiovascular: Denies chest pain. Respiratory: Denies shortness of breath. Gastrointestinal: No abdominal pain.  No nausea, no vomiting.  No diarrhea.  No constipation. Genitourinary: Negative for dysuria. Musculoskeletal: Negative for back pain. Skin: Negative for rash. Neurological: Negative for headaches, focal weakness or numbness. Endocrine: Hypertension   ____________________________________________   PHYSICAL EXAM:  VITAL SIGNS: ED Triage Vitals  Enc Vitals Group     BP 12/14/19 1236 (!) 157/109     Pulse Rate 12/14/19 1236 95     Resp 12/14/19 1236 18     Temp 12/14/19 1236 99.2 F (37.3 C)     Temp Source 12/14/19 1236  Oral     SpO2 12/14/19 1236 96 %     Weight 12/14/19 1238 (!) 302 lb 14.6 oz (137.4 kg)     Height 12/14/19 1238 5\' 3"  (1.6 m)     Head Circumference --      Peak Flow --      Pain Score 12/14/19 1238 10     Pain Loc --      Pain Edu? --      Excl. in GC? --     Constitutional: Alert and oriented. Well appearing and in no acute distress.  Mouth/Throat: Mucous membranes are moist.  Oropharynx non-erythematous.  Gingiva edema and devitalized right lower molar. Neck: No stridor.   Hematological/Lymphatic/Immunilogical: No cervical lymphadenopathy. Cardiovascular: Normal rate, regular rhythm. Grossly normal heart sounds.  Good peripheral circulation. Respiratory: Normal respiratory effort.  No retractions. Lungs CTAB. Skin:  Skin is warm, dry and intact. No rash noted. Psychiatric: Mood and affect are normal. Speech and behavior are normal.  ____________________________________________   LABS (all labs ordered are listed, but only abnormal results are displayed)  Labs Reviewed - No data to display ____________________________________________  EKG   ____________________________________________  RADIOLOGY  ED MD interpretation:    Official radiology report(s): No results found.  ____________________________________________   PROCEDURES  Procedure(s) performed (including Critical Care):  Procedures   ____________________________________________   INITIAL IMPRESSION / ASSESSMENT AND PLAN / ED COURSE  As part of my medical decision making, I reviewed the following data within the electronic MEDICAL RECORD NUMBER     Patient presents with dental pain secondary to devitalized right lower molar and gingivitis.  Patient advised continue antibiotics and follow-up as scheduled dental appointment on 12/16/2019.  Patient given prescription for Percocet and ibuprofen.    Amber Burnett was evaluated in Emergency Department on 12/14/2019 for the symptoms described in the history of present illness. She was evaluated in the context of the global COVID-19 pandemic, which necessitated consideration that the patient might be at risk for infection with the SARS-CoV-2 virus that causes COVID-19. Institutional protocols and algorithms that pertain to the evaluation of patients at risk for COVID-19 are in a state of rapid change based on information released by regulatory bodies including the CDC and federal and state organizations. These policies and algorithms were followed  during the patient's care in the ED.       ____________________________________________   FINAL CLINICAL IMPRESSION(S) / ED DIAGNOSES  Final diagnoses:  Pain, dental     ED Discharge Orders         Ordered    oxyCODONE-acetaminophen (PERCOCET) 5-325 MG tablet  Every 6 hours PRN     Discontinue  Reprint     12/14/19 1323    ibuprofen (ADVIL) 600 MG tablet  Every 8 hours PRN     Discontinue  Reprint     12/14/19 1323           Note:  This document was prepared using Dragon voice recognition software and may include unintentional dictation errors.    02/13/20, PA-C 12/14/19 1328    02/13/20, MD 12/14/19 1626

## 2019-12-14 NOTE — Discharge Instructions (Addendum)
Follow-up with scheduled dental appointment. °

## 2019-12-14 NOTE — ED Triage Notes (Signed)
Pt presents to ED via POV with c/o R sided dental pain, pt states continued pain since Wednesday.

## 2019-12-28 ENCOUNTER — Emergency Department
Admission: EM | Admit: 2019-12-28 | Discharge: 2019-12-28 | Discharge status: Against Medical Advice | Payer: Medicaid Other

## 2019-12-30 ENCOUNTER — Other Ambulatory Visit: Payer: Self-pay | Admitting: Physician Assistant

## 2019-12-30 DIAGNOSIS — M546 Pain in thoracic spine: Secondary | ICD-10-CM

## 2019-12-30 DIAGNOSIS — M5442 Lumbago with sciatica, left side: Secondary | ICD-10-CM

## 2019-12-31 ENCOUNTER — Ambulatory Visit
Admission: RE | Admit: 2019-12-31 | Discharge: 2019-12-31 | Disposition: A | Discharge status: Home / Self Care | Payer: Medicaid Other | Attending: Physician Assistant | Admitting: Physician Assistant

## 2019-12-31 ENCOUNTER — Ambulatory Visit
Admission: RE | Admit: 2019-12-31 | Discharge: 2019-12-31 | Disposition: A | Discharge status: Home / Self Care | Payer: Medicaid Other | Source: Ambulatory Visit | Attending: Physician Assistant | Admitting: Physician Assistant

## 2019-12-31 ENCOUNTER — Other Ambulatory Visit: Payer: Self-pay

## 2019-12-31 ENCOUNTER — Other Ambulatory Visit: Payer: Self-pay | Admitting: Physician Assistant

## 2019-12-31 DIAGNOSIS — M546 Pain in thoracic spine: Secondary | ICD-10-CM

## 2019-12-31 DIAGNOSIS — M5442 Lumbago with sciatica, left side: Secondary | ICD-10-CM | POA: Insufficient documentation

## 2020-01-17 ENCOUNTER — Ambulatory Visit: Payer: Self-pay

## 2020-01-31 ENCOUNTER — Ambulatory Visit: Admission: EM | Admit: 2020-01-31 | Disposition: A | Discharge status: Home / Self Care | Payer: Medicaid Other

## 2020-01-31 ENCOUNTER — Ambulatory Visit: Payer: Self-pay

## 2020-09-30 ENCOUNTER — Other Ambulatory Visit: Payer: Self-pay

## 2020-09-30 ENCOUNTER — Encounter: Payer: Self-pay | Admitting: Emergency Medicine

## 2020-09-30 ENCOUNTER — Emergency Department
Admission: EM | Admit: 2020-09-30 | Discharge: 2020-09-30 | Disposition: A | Discharge status: Home / Self Care | Payer: Medicaid Other | Attending: Emergency Medicine | Admitting: Emergency Medicine

## 2020-09-30 ENCOUNTER — Emergency Department: Payer: Medicaid Other

## 2020-09-30 DIAGNOSIS — N3001 Acute cystitis with hematuria: Secondary | ICD-10-CM | POA: Insufficient documentation

## 2020-09-30 DIAGNOSIS — D72829 Elevated white blood cell count, unspecified: Secondary | ICD-10-CM | POA: Diagnosis not present

## 2020-09-30 DIAGNOSIS — Z87891 Personal history of nicotine dependence: Secondary | ICD-10-CM | POA: Insufficient documentation

## 2020-09-30 DIAGNOSIS — R319 Hematuria, unspecified: Secondary | ICD-10-CM | POA: Diagnosis present

## 2020-09-30 LAB — URINALYSIS, COMPLETE (UACMP) WITH MICROSCOPIC
Bacteria, UA: NONE SEEN
Bilirubin Urine: NEGATIVE
Glucose, UA: NEGATIVE mg/dL
Ketones, ur: NEGATIVE mg/dL
Nitrite: NEGATIVE
Protein, ur: 30 mg/dL — AB
Specific Gravity, Urine: 1.02 (ref 1.005–1.030)
Squamous Epithelial / HPF: NONE SEEN (ref 0–5)
WBC, UA: NONE SEEN WBC/hpf (ref 0–5)
pH: 6 (ref 5.0–8.0)

## 2020-09-30 LAB — POC URINE PREG, ED: Preg Test, Ur: NEGATIVE

## 2020-09-30 MED ORDER — CEPHALEXIN 500 MG PO CAPS: 500.0000 mg | ORAL_CAPSULE | Freq: Four times a day (QID) | ORAL | 0 refills | Status: AC

## 2020-09-30 MED ORDER — CEFTRIAXONE SODIUM 1 G IJ SOLR
1.0000 g | Freq: Once | INTRAMUSCULAR | Status: AC
  Administered 2020-09-30: 1 g via INTRAMUSCULAR
  Filled 2020-09-30: qty 10

## 2020-09-30 MED ORDER — LIDOCAINE HCL (PF) 1 % IJ SOLN
2.0000 mL | Freq: Once | INTRAMUSCULAR | Status: AC
  Administered 2020-09-30: 2 mL

## 2020-09-30 NOTE — ED Triage Notes (Signed)
C/O blood in urine today and possibly 'passing tissue'.  Patient states she is on birthcontrol.   AAOx3.  Skin warm and dry. NAD

## 2020-09-30 NOTE — ED Provider Notes (Signed)
[facility]-EMERGENCY DEPARTMENT  ____________________________________________  Time seen: Approximately 8:41 PM  I have reviewed the triage vital signs and the nursing notes.   HISTORY  Chief Complaint No chief complaint on file.   Historian Patient    HPI Amber Burnett is a 35 y.o. female presents to the emergency department with bilateral low back pain, some increased urinary frequency and hematuria that started today.  Patient denies dysuria.  She denies history of nephrolithiasis or pyelonephritis.  No fever.  No history of similar symptoms in the past.   Past Medical History:  Diagnosis Date  . HTN (hypertension)      Immunizations up to date:  Yes.     Past Medical History:  Diagnosis Date  . HTN (hypertension)     Patient Active Problem List   Diagnosis Date Noted  . Herpes genitalis in women 01/30/2019  . Obesity, morbid, BMI 50 or higher (HCC) 01/30/2019    Past Surgical History:  Procedure Laterality Date  . DILATION AND CURETTAGE OF UTERUS    . HYSTEROSCOPY WITH D & C N/A 02/05/2019   Procedure: DILATATION AND CURETTAGE /HYSTEROSCOPY;  Surgeon: Natale Milch, MD;  Location: [facility] ORS;  Service: Gynecology;  Laterality: N/A;  . TONSILLECTOMY AND ADENOIDECTOMY      Prior to Admission medications   Medication Sig Start Date End Date Taking? Authorizing Provider  cephALEXin (KEFLEX) 500 MG capsule Take 1 capsule (500 mg total) by mouth 4 (four) times daily for 7 days. 09/30/20 10/07/20 Yes Pia Mau M, PA-C  fluticasone (FLONASE) 50 MCG/ACT nasal spray Place 2 sprays into both nostrils daily. 11/02/19 11/01/20  Enid Derry, PA-C  hydrochlorothiazide (HYDRODIURIL) 25 MG tablet Take 1 tablet (25 mg total) by mouth daily. 06/07/18   Minna Antis, MD  ibuprofen (ADVIL) 600 MG tablet Take 1 tablet (600 mg total) by mouth every 8 (eight) hours as needed. 12/14/19   Joni Reining, PA-C  levonorgestrel (MIRENA, 52 MG,) 20 MCG/24HR IUD 1 Intra  Uterine Device (1 each total) by Intrauterine route once for 1 dose. 10/28/19 10/28/19  Copland, Helmut Muster B, PA-C  lidocaine (XYLOCAINE) 2 % solution Use as directed 10 mLs in the mouth or throat as needed. 12/11/19   Enid Derry, PA-C    Allergies Patient has no known allergies.  No family history on file.  Social History Social History   Tobacco Use  . Smoking status: Former Games developer  . Smokeless tobacco: Never Used  Vaping Use  . Vaping Use: Never used  Substance Use Topics  . Alcohol use: No  . Drug use: Not Currently    Types: Marijuana     Review of Systems  Constitutional: No fever/chills Eyes:  No discharge ENT: No upper respiratory complaints. Respiratory: no cough. No SOB/ use of accessory muscles to breath Gastrointestinal:   No nausea, no vomiting.  No diarrhea.  No constipation. Musculoskeletal: Negative for musculoskeletal pain. Skin: Negative for rash, abrasions, lacerations, ecchymosis.    ____________________________________________   PHYSICAL EXAM:  VITAL SIGNS: ED Triage Vitals  Enc Vitals Group     BP 09/30/20 1802 (!) 150/104     Pulse Rate 09/30/20 1802 92     Resp 09/30/20 1802 18     Temp 09/30/20 1802 98.6 F (37 C)     Temp Source 09/30/20 1802 Oral     SpO2 09/30/20 1802 99 %     Weight 09/30/20 1756 (!) 302 lb 14.6 oz (137.4 kg)     Height --  Head Circumference --      Peak Flow --      Pain Score 09/30/20 1755 0     Pain Loc --      Pain Edu? --      Excl. in GC? --      Constitutional: Alert and oriented. Well appearing and in no acute distress. Eyes: Conjunctivae are normal. PERRL. EOMI. Head: Atraumatic. ENT:      Nose: No congestion/rhinnorhea.      Mouth/Throat: Mucous membranes are moist.  Neck: No stridor.  No cervical spine tenderness to palpation. Cardiovascular: Normal rate, regular rhythm. Normal S1 and S2.  Good peripheral circulation. Respiratory: Normal respiratory effort without tachypnea or retractions.  Lungs CTAB. Good air entry to the bases with no decreased or absent breath sounds Gastrointestinal: Bowel sounds x 4 quadrants. Soft and nontender to palpation. No guarding or rigidity. No distention. No CVA tenderness.  Musculoskeletal: Full range of motion to all extremities. No obvious deformities noted Neurologic:  Normal for age. No gross focal neurologic deficits are appreciated.  Skin:  Skin is warm, dry and intact. No rash noted. Psychiatric: Mood and affect are normal for age. Speech and behavior are normal.   ____________________________________________   LABS (all labs ordered are listed, but only abnormal results are displayed)  Labs Reviewed  URINALYSIS, COMPLETE (UACMP) WITH MICROSCOPIC - Abnormal; Notable for the following components:      Result Value   Color, Urine YELLOW (*)    APPearance CLOUDY (*)    Hgb urine dipstick LARGE (*)    Protein, ur 30 (*)    Leukocytes,Ua LARGE (*)    All other components within normal limits  POC URINE PREG, ED   ____________________________________________  EKG   ____________________________________________  RADIOLOGY Geraldo Pitter, personally viewed and evaluated these images (plain radiographs) as part of my medical decision making, as well as reviewing the written report by the radiologist.    CT Renal Stone Study  Result Date: 09/30/2020 CLINICAL DATA:  35 year old female with flank pain. Concern for kidney stone. EXAM: CT ABDOMEN AND PELVIS WITHOUT CONTRAST TECHNIQUE: Multidetector CT imaging of the abdomen and pelvis was performed following the standard protocol without IV contrast. COMPARISON:  None. FINDINGS: Evaluation of this exam is limited in the absence of intravenous contrast. Lower chest: The visualized lung bases are clear. No intra-abdominal free air or free fluid. Hepatobiliary: No focal liver abnormality is seen. No gallstones, gallbladder wall thickening, or biliary dilatation. Pancreas: Unremarkable. No  pancreatic ductal dilatation or surrounding inflammatory changes. Spleen: Normal in size without focal abnormality. Adrenals/Urinary Tract: The adrenal glands unremarkable. The kidneys, visualized ureters, and urinary bladder appear unremarkable. Stomach/Bowel: There is no bowel obstruction or active inflammation. The appendix is normal. Vascular/Lymphatic: The abdominal aorta and IVC are grossly unremarkable on this noncontrast CT. No portal venous gas. There is no adenopathy. Reproductive: The uterus is anteverted and grossly unremarkable. An intrauterine device is noted. No adnexal masses. Other: None Musculoskeletal: No acute or significant osseous findings. IMPRESSION: No acute intra-abdominal or pelvic pathology. No hydronephrosis or nephrolithiasis. Electronically Signed   By: Elgie Collard M.D.   On: 09/30/2020 21:02    ____________________________________________    PROCEDURES  Procedure(s) performed:     Procedures     Medications  cefTRIAXone (ROCEPHIN) injection 1 g (has no administration in time range)     ____________________________________________   INITIAL IMPRESSION / ASSESSMENT AND PLAN / ED COURSE  Pertinent labs & imaging results that were  available during my care of the patient were reviewed by me and considered in my medical decision making (see chart for details).      Assessment and Plan:  Flank pain:  35 year old female presents to the emergency department with increased urinary frequency and hematuria today.  Urinalysis shows a large amount of leukocytes and a large amount of blood.  CT renal stone study showed no acute abnormality.  We will treat patient with Rocephin and Keflex with return precautions given to return with new or worsening symptoms.    ____________________________________________  FINAL CLINICAL IMPRESSION(S) / ED DIAGNOSES  Final diagnoses:  Acute cystitis with hematuria      NEW MEDICATIONS STARTED DURING THIS  VISIT:  ED Discharge Orders         Ordered    cephALEXin (KEFLEX) 500 MG capsule  4 times daily        09/30/20 2113              This chart was dictated using voice recognition software/Dragon. Despite best efforts to proofread, errors can occur which can change the meaning. Any change was purely unintentional.     Gasper Lloyd 09/30/20 2116    Dionne Bucy, MD 09/30/20 2238

## 2020-09-30 NOTE — Discharge Instructions (Addendum)
Take Keflex four times daily for one week.

## 2020-11-02 ENCOUNTER — Other Ambulatory Visit: Payer: Self-pay

## 2021-04-06 NOTE — Telephone Encounter (Signed)
Mirena rcvd/charged 10/28/2019
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
## 2022-02-24 ENCOUNTER — Other Ambulatory Visit: Payer: Self-pay

## 2022-02-24 ENCOUNTER — Emergency Department: Payer: Medicaid Other

## 2022-02-24 ENCOUNTER — Emergency Department
Admission: EM | Admit: 2022-02-24 | Discharge: 2022-02-24 | Disposition: A | Discharge status: Home / Self Care | Payer: Medicaid Other | Attending: Emergency Medicine | Admitting: Emergency Medicine

## 2022-02-24 ENCOUNTER — Encounter: Payer: Self-pay | Admitting: Emergency Medicine

## 2022-02-24 DIAGNOSIS — Z20822 Contact with and (suspected) exposure to covid-19: Secondary | ICD-10-CM | POA: Insufficient documentation

## 2022-02-24 DIAGNOSIS — R0981 Nasal congestion: Secondary | ICD-10-CM | POA: Diagnosis present

## 2022-02-24 DIAGNOSIS — J069 Acute upper respiratory infection, unspecified: Secondary | ICD-10-CM | POA: Insufficient documentation

## 2022-02-24 LAB — RESP PANEL BY RT-PCR (FLU A&B, COVID) ARPGX2
Influenza A by PCR: NEGATIVE
Influenza B by PCR: NEGATIVE
SARS Coronavirus 2 by RT PCR: NEGATIVE

## 2022-02-24 MED ORDER — ACETAMINOPHEN 325 MG PO TABS
650.0000 mg | ORAL_TABLET | Freq: Once | ORAL | Status: AC
  Administered 2022-02-24: 650 mg via ORAL
  Filled 2022-02-24: qty 2

## 2022-02-24 NOTE — ED Provider Triage Note (Signed)
Emergency Medicine Provider Triage Evaluation Note  Amber Burnett , a 36 y.o. female  was evaluated in triage.  Pt complains of cough, congestion x5 days.  No fevers.  No sore throat nausea vomiting abdominal pain or diarrhea.  No chest pain or shortness of breath.  Mild intermittent wheezing.  Review of Systems  Positive: Cough, congestion Negative: Fevers, shortness of breath  Physical Exam  BP (!) 143/102 (BP Location: Left Arm)   Pulse 84   Temp 98.5 F (36.9 C) (Oral)   Resp 18   SpO2 97%  Gen:   Awake, no distress   Resp:  Normal effort  MSK:   Moves extremities without difficulty  Other:    Medical Decision Making  Medically screening exam initiated at 5:18 PM.  Appropriate orders placed.  Baird Lyons was informed that the remainder of the evaluation will be completed by another provider, this initial triage assessment does not replace that evaluation, and the importance of remaining in the ED until their evaluation is complete.     Duanne Guess, PA-C 02/24/22 1719

## 2022-02-24 NOTE — ED Triage Notes (Signed)
Pt with congestion and cough since Saturday.  Cough seems to have resolved but still having congestion. Pt does work in a daycare.

## 2022-02-24 NOTE — Discharge Instructions (Addendum)
Your COVID/flu test was negative.  Your chest x-ray does not show any evidence of pneumonia.  You likely have another virus causing your symptoms.  You may continue to take over-the-counter medications to help with your symptoms.  Please return for any new, worsening, or change in symptoms or other concerns.

## 2022-02-24 NOTE — ED Provider Notes (Signed)
[facility] Provider Note    Event Date/Time   First MD Initiated Contact with Patient 02/24/22 1739     (approximate)   History   Nasal Congestion   HPI  Amber Burnett is a 36 y.o. female who presents today for evaluation of stuffy nose and dry cough for the past 5 days.  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  She has not had any fevers or chills.  She does not have chest pain or feel short of breath.  No nausea, vomiting, diarrhea.  She denies sore throat or trouble swallowing.  She has not had any neck pain or stiffness.  No headaches.  Patient Active Problem List   Diagnosis Date Noted   Herpes genitalis in women 01/30/2019   Obesity, morbid, BMI 50 or higher ([place]) 01/30/2019          Physical Exam   Triage Vital Signs: ED Triage Vitals  Enc Vitals Group     BP 02/24/22 1714 (!) 143/102     Pulse Rate 02/24/22 1714 84     Resp 02/24/22 1714 18     Temp 02/24/22 1714 98.5 F (36.9 C)     Temp Source 02/24/22 1714 Oral     SpO2 02/24/22 1714 97 %     Weight --      Height --      Head Circumference --      Peak Flow --      Pain Score 02/24/22 1720 0     Pain Loc --      Pain Edu? --      Excl. in [place]? --     Most recent vital signs: Vitals:   02/24/22 1714  BP: (!) 143/102  Pulse: 84  Resp: 18  Temp: 98.5 F (36.9 C)  SpO2: 97%    Physical Exam Vitals and nursing note reviewed.  Constitutional:      General: Awake and alert. No acute distress.  Well-appearing    Appearance: Normal appearance. The patient is obese.  HENT:     Head: Normocephalic and atraumatic.     Mouth: Mucous membranes are moist.  Nasal congestion present. Uvula midline.  No tonsillar exudate.  No soft palate fluctuance.  No trismus.  No voice change.  No sublingual swelling.  No tender cervical lymphadenopathy.  No nuchal rigidity Eyes:     General: PERRL. Normal EOMs         Right eye: No discharge.        Left eye: No discharge.     Conjunctiva/sclera: Conjunctivae normal.  Cardiovascular:     Rate and Rhythm: Normal rate and regular rhythm.     Pulses: Normal pulses.     Heart sounds: Normal heart sounds Pulmonary:     Effort: Pulmonary effort is normal. No respiratory distress.     Breath sounds: Normal breath sounds.  Abdominal:     Abdomen is soft. There is no abdominal tenderness. No rebound or guarding. No distention. Musculoskeletal:        General: No swelling. Normal range of motion.     Cervical back: Normal range of motion and neck supple.  No nuchal rigidity No lower extremity swelling Skin:    General: Skin is warm and dry.     Capillary Refill: Capillary refill takes less than 2 seconds.     Findings: No rash.  Neurological:  Mental Status: The patient is awake and alert.      ED Results / Procedures / Treatments   Labs (all labs ordered are listed, but only abnormal results are displayed) Labs Reviewed  RESP PANEL BY RT-PCR (FLU A&B, COVID) ARPGX2     EKG     RADIOLOGY I independently reviewed and interpreted imaging and agree with radiologists findings.     PROCEDURES:  Critical Care performed:   Procedures   MEDICATIONS ORDERED IN ED: Medications  acetaminophen (TYLENOL) tablet 650 mg (650 mg Oral Given 02/24/22 1830)     IMPRESSION / MDM / ASSESSMENT AND PLAN / ED COURSE  I reviewed the triage vital signs and the nursing notes.   Differential diagnosis includes, but is not limited to, COVID, flu, bronchitis, pneumonia, other URI.  Patient is awake and alert, hemodynamically stable and afebrile.  She has nasal congestion present, though otherwise is quite well-appearing.  No voice change, uvular deviation, dysphagia or odynophagia, neck pain, drooling to suggest peritonsillar or retropharyngeal abscess.  X-ray was obtained in triage, this demonstrates no acute cardiopulmonary abnormality.  COVID/flu swab  obtained and is negative.  Patient requested a work note for today and tomorrow which was provided.  We discussed symptomatic management and return precautions.  Patient understands and agrees with plan.  She was discharged in stable condition.   Patient's presentation is most consistent with acute illness / injury with system symptoms.      FINAL CLINICAL IMPRESSION(S) / ED DIAGNOSES   Final diagnoses:  Upper respiratory tract infection, unspecified type  Nasal congestion     Rx / DC Orders   ED Discharge Orders     None        Note:  This document was prepared using Dragon voice recognition software and may include unintentional dictation errors.   Jackelyn Hoehn, PA-C 02/24/22 1858    Concha Se, MD 03/01/22 [phone]

## 2022-05-13 ENCOUNTER — Emergency Department
Admission: EM | Admit: 2022-05-13 | Discharge: 2022-05-13 | Disposition: A | Discharge status: Home / Self Care | Payer: Medicaid Other | Attending: Emergency Medicine | Admitting: Emergency Medicine

## 2022-05-13 ENCOUNTER — Encounter: Payer: Self-pay | Admitting: Emergency Medicine

## 2022-05-13 ENCOUNTER — Other Ambulatory Visit: Payer: Self-pay

## 2022-05-13 DIAGNOSIS — Z20822 Contact with and (suspected) exposure to covid-19: Secondary | ICD-10-CM | POA: Insufficient documentation

## 2022-05-13 DIAGNOSIS — R0981 Nasal congestion: Secondary | ICD-10-CM | POA: Diagnosis present

## 2022-05-13 DIAGNOSIS — J029 Acute pharyngitis, unspecified: Secondary | ICD-10-CM | POA: Insufficient documentation

## 2022-05-13 LAB — RESP PANEL BY RT-PCR (RSV, FLU A&B, COVID)  RVPGX2
Influenza A by PCR: NEGATIVE
Influenza B by PCR: NEGATIVE
Resp Syncytial Virus by PCR: NEGATIVE
SARS Coronavirus 2 by RT PCR: NEGATIVE

## 2022-05-13 LAB — GROUP A STREP BY PCR: Group A Strep by PCR: NOT DETECTED

## 2022-05-13 MED ORDER — DEXAMETHASONE 4 MG PO TABS
10.0000 mg | ORAL_TABLET | Freq: Once | ORAL | Status: AC
  Administered 2022-05-13: 10 mg via ORAL
  Filled 2022-05-13: qty 3

## 2022-05-13 MED ORDER — IBUPROFEN 600 MG PO TABS
600.0000 mg | ORAL_TABLET | Freq: Once | ORAL | Status: AC
  Administered 2022-05-13: 600 mg via ORAL
  Filled 2022-05-13: qty 1

## 2022-05-13 NOTE — ED Triage Notes (Signed)
Pt states coming in for congestion for a week and a sore throat that started yesterday. Pt states no fever, but cold chills

## 2022-05-13 NOTE — Discharge Instructions (Signed)
Your COVID/flu/RSV and strep test were negative.  You likely have a viral infection.  Please continue to take Tylenol/ibuprofen provide instructions to have your symptoms.  It was a pleasure caring for you today.

## 2022-05-13 NOTE — ED Provider Notes (Signed)
[facility] Provider Note    Event Date/Time   First MD Initiated Contact with Patient 05/13/22 1220     (approximate)   History   Sore Throat   HPI  Amber Burnett is a 37 y.o. female with no reported past medical history presents today for evaluation of nasal congestion and sore throat.  She reports that her nasal congestion began 1 week ago, and her sore throat began 2 days ago.  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  She has not had a fever or chills.  She denies any voice changes or drooling.  Patient Active Problem List   Diagnosis Date Noted   Herpes genitalis in women 01/30/2019   Obesity, morbid, BMI 50 or higher ([place]) 01/30/2019          Physical Exam   Triage Vital Signs: ED Triage Vitals  Enc Vitals Group     BP 05/13/22 1012 (!) 131/90     Pulse Rate 05/13/22 1012 91     Resp 05/13/22 1012 18     Temp 05/13/22 1012 98.6 F (37 C)     Temp Source 05/13/22 1012 Oral     SpO2 05/13/22 1012 98 %     Weight 05/13/22 1013 300 lb (136.1 kg)     Height 05/13/22 1013 5\' 3"  (1.6 m)     Head Circumference --      Peak Flow --      Pain Score 05/13/22 1012 9     Pain Loc --      Pain Edu? --      Excl. in [place]? --     Most recent vital signs: Vitals:   05/13/22 1012  BP: (!) 131/90  Pulse: 91  Resp: 18  Temp: 98.6 F (37 C)  SpO2: 98%    Physical Exam Vitals and nursing note reviewed.  Constitutional:      General: Awake and alert. No acute distress.    Appearance: Normal appearance. The patient is obese.  HENT:     Head: Normocephalic and atraumatic.     Mouth: Mucous membranes are moist.  Uvula midline.  Mild posterior oropharyngeal erythema.  No tonsillar exudate.  No soft palate fluctuance.  No trismus.  No voice change.  No sublingual swelling.  No tender cervical lymphadenopathy.  No nuchal rigidity Nasal congestion present Eyes:     General: PERRL. Normal EOMs        Right eye: No  discharge.        Left eye: No discharge.     Conjunctiva/sclera: Conjunctivae normal.  Cardiovascular:     Rate and Rhythm: Normal rate and regular rhythm.     Pulses: Normal pulses.  Pulmonary:     Effort: Pulmonary effort is normal. No respiratory distress.     Breath sounds: Normal breath sounds.  Able to speak easily in complete sentences Abdominal:     Abdomen is soft. There is no abdominal tenderness. No rebound or guarding. No distention. Musculoskeletal:        General: No swelling. Normal range of motion.     Cervical back: Normal range of motion and neck supple.  Skin:    General: Skin is warm and dry.     Capillary Refill: Capillary refill takes less than 2 seconds.     Findings: No rash.  Neurological:     Mental Status: The patient is awake and alert.  ED Results / Procedures / Treatments   Labs (all labs ordered are listed, but only abnormal results are displayed) Labs Reviewed  RESP PANEL BY RT-PCR (RSV, FLU A&B, COVID)  RVPGX2  GROUP A STREP BY PCR     EKG     RADIOLOGY     PROCEDURES:  Critical Care performed:   Procedures   MEDICATIONS ORDERED IN ED: Medications  dexamethasone (DECADRON) tablet 10 mg (10 mg Oral Given 05/13/22 1237)  ibuprofen (ADVIL) tablet 600 mg (600 mg Oral Given 05/13/22 1237)     IMPRESSION / MDM / ASSESSMENT AND PLAN / ED COURSE  I reviewed the triage vital signs and the nursing notes.   Differential diagnosis includes, but is not limited to, viral URI, viral pharyngitis, strep pharyngitis, COVID, influenza, RSV.  Patient is awake and alert, hemodynamically stable and afebrile.  She has no trismus, no neck pain, no drooling, uvula is midline, do not suspect peritonsillar or retropharyngeal abscess.  COVID/flu/RSV and strep throat swabs are negative.  Most likely etiology is viral pharyngitis.  She was treated symptomatically with Decadron and ibuprofen with improvement of her symptoms.  We discussed continued  symptomatic management and return precautions.  Patient understands and agrees with plan.  She was discharged in stable condition.  Patient was seen in the hallway given the busyness of the ER.  I asked if she was comfortable being seen in the hallway or if she would prefer until a room is available and she reported that she was comfortable being seen in the hallway did not wish to wait for an available room.  I asked her if she felt comfortable giving me all of the information that she feels needs to be relayed today and she reports that she does and would like to continue the history and physical exam in the hallway.  Patient's presentation is most consistent with acute complicated illness / injury requiring diagnostic workup.    FINAL CLINICAL IMPRESSION(S) / ED DIAGNOSES   Final diagnoses:  Viral pharyngitis     Rx / DC Orders   ED Discharge Orders     None        Note:  This document was prepared using Dragon voice recognition software and may include unintentional dictation errors.   Amber Burnett 05/13/22 1437    Carrie Mew, MD 05/13/22 [phone]

## 2022-07-29 IMAGING — CT CT RENAL STONE PROTOCOL
2 of 4 series · 16 of 46 positions shown, 18 images · non-contrast
Comparison: None.

CLINICAL DATA: 34-year-old female with flank pain. Concern for
kidney stone.

EXAM:
CT ABDOMEN AND PELVIS WITHOUT CONTRAST
TECHNIQUE: Multidetector CT imaging of the abdomen and pelvis was performed
following the standard protocol without IV contrast.

[Series 2: stone full standard · axial · 0.80mm/px · z∈[-535,-75]mm · 13 of 102 slices shown, 15 images]
[im 5/102  soft-tissue]
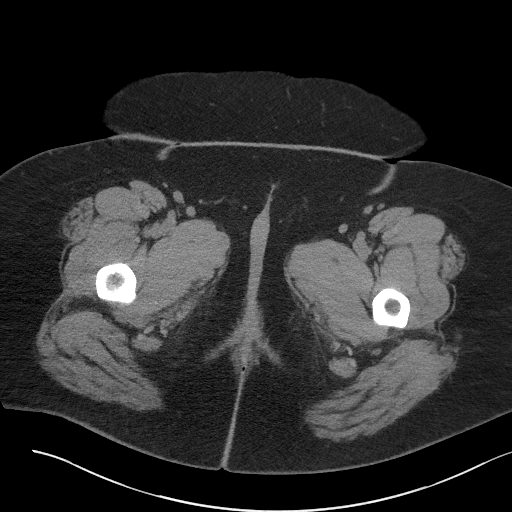
[im 5/102  bone]
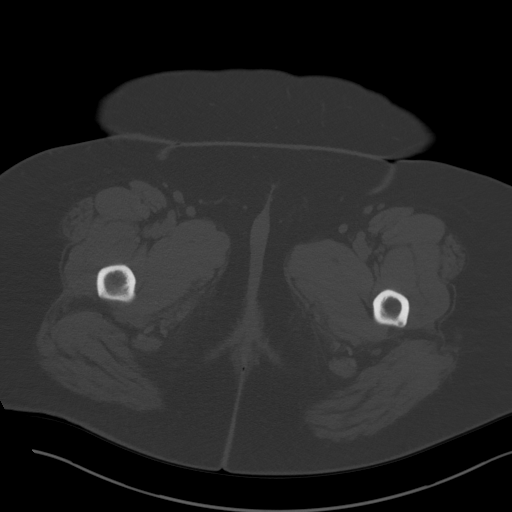
[im 13/102  soft-tissue]
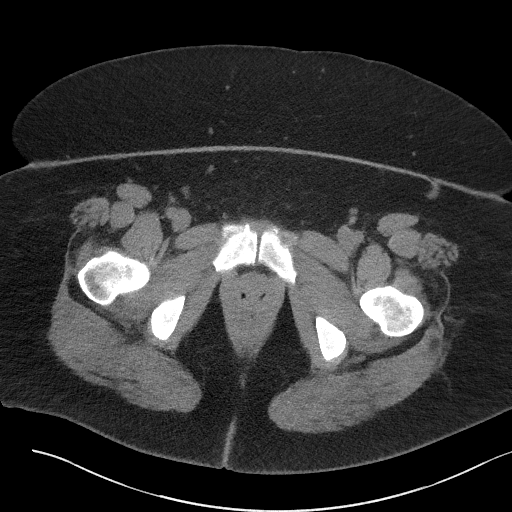
[im 22/102  soft-tissue]
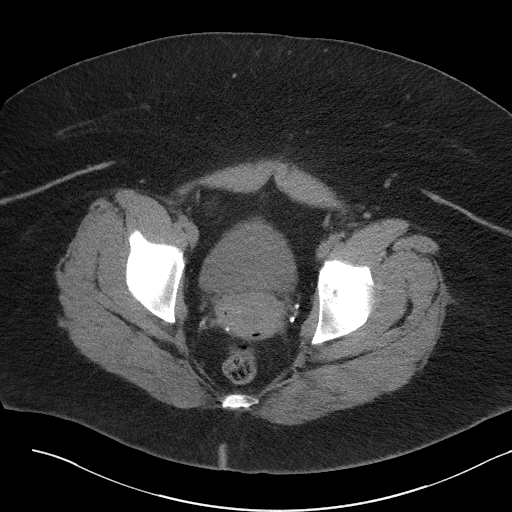
[im 30/102  soft-tissue]
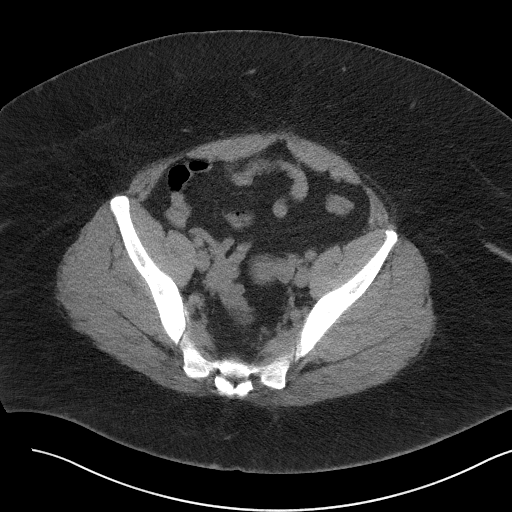
[im 34/102  soft-tissue]
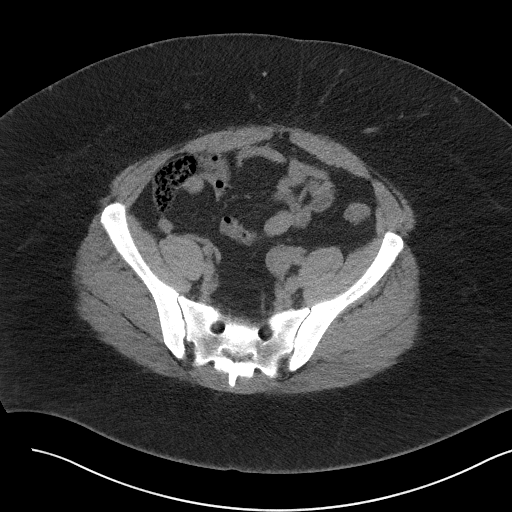
[im 43/102  soft-tissue]
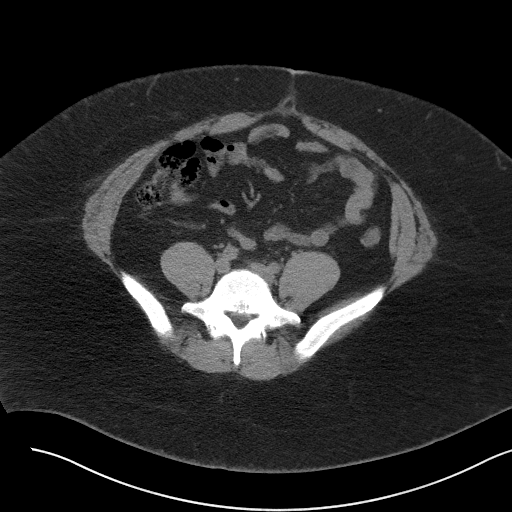
[im 51/102  soft-tissue]
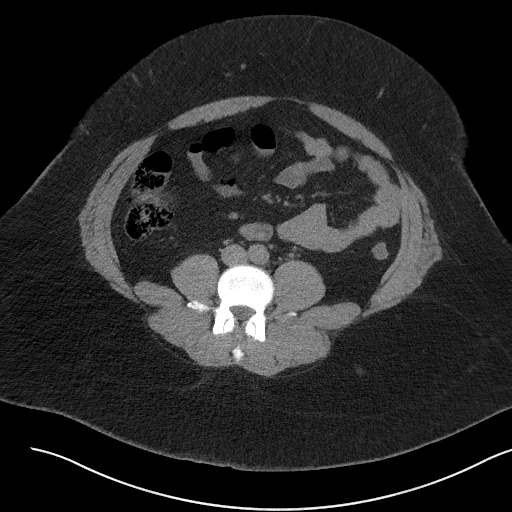
[im 59/102  soft-tissue]
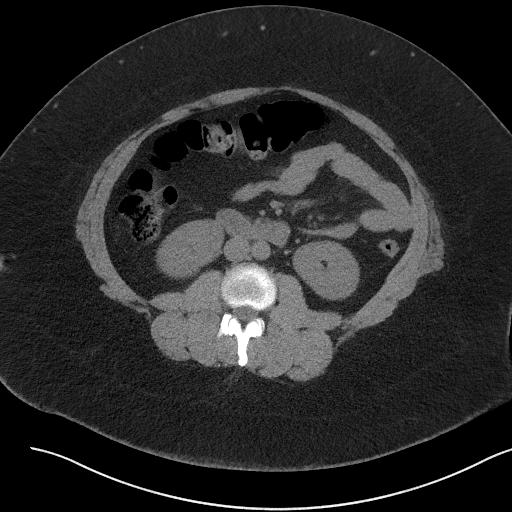
[im 68/102  soft-tissue]
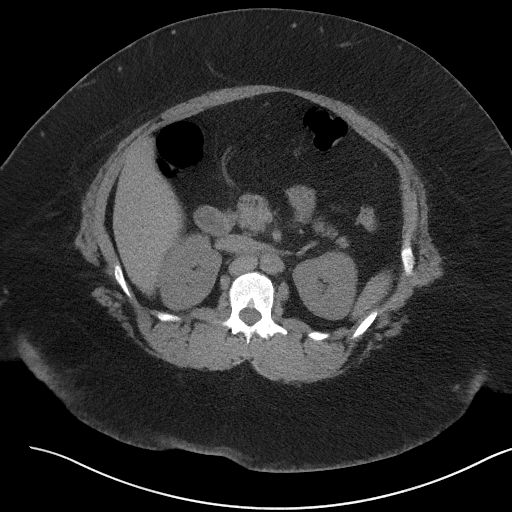
[im 68/102  bone]
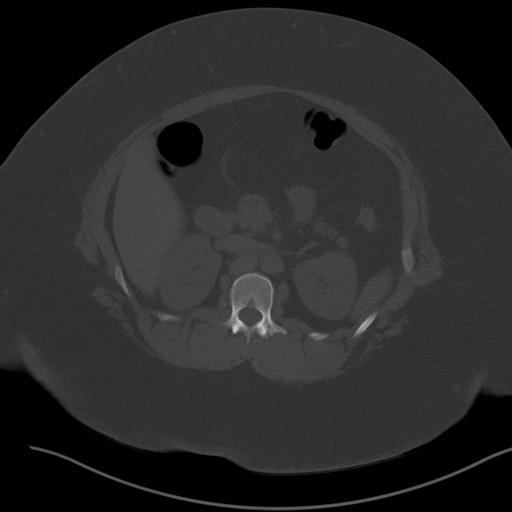
[im 72/102  soft-tissue]
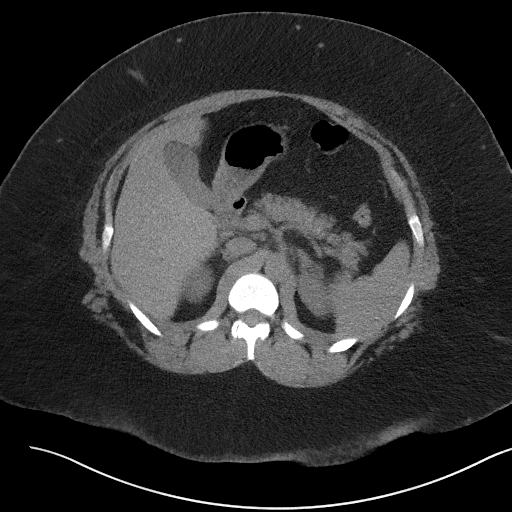
[im 80/102  soft-tissue]
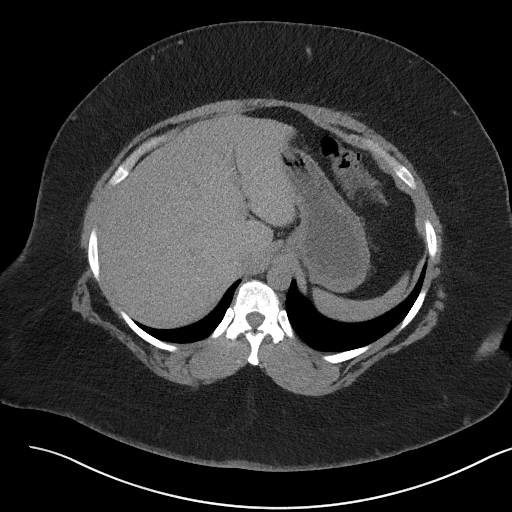
[im 89/102  soft-tissue]
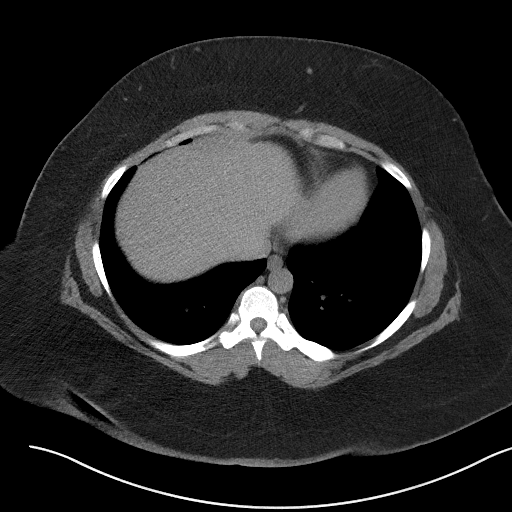
[im 97/102  soft-tissue]
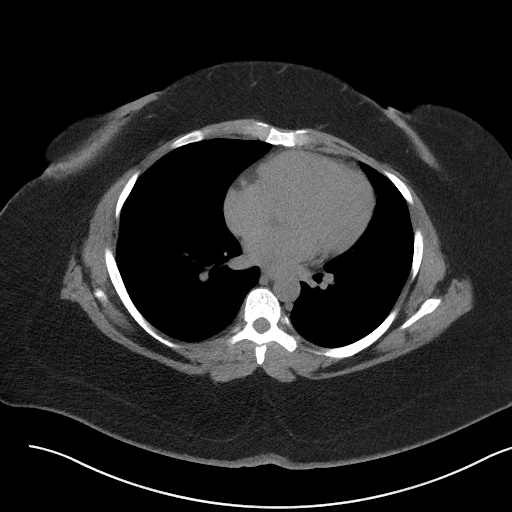

[Series 5: coronal · coronal · 0.77mm/px · 3 of 189 slices shown]
[im 63/189  soft-tissue]
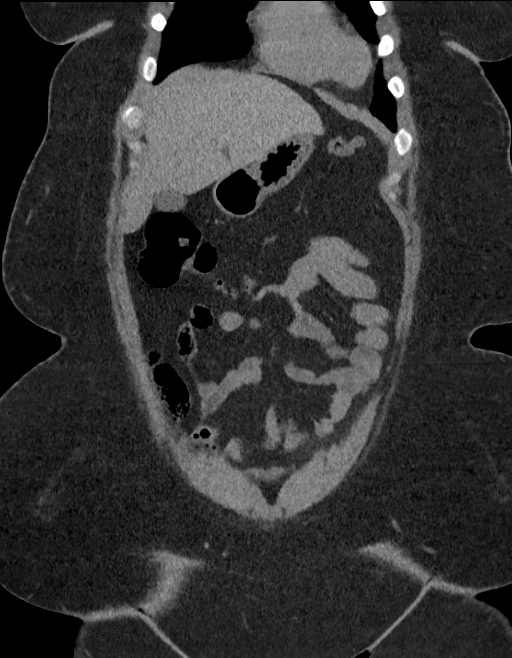
[im 84/189  soft-tissue]
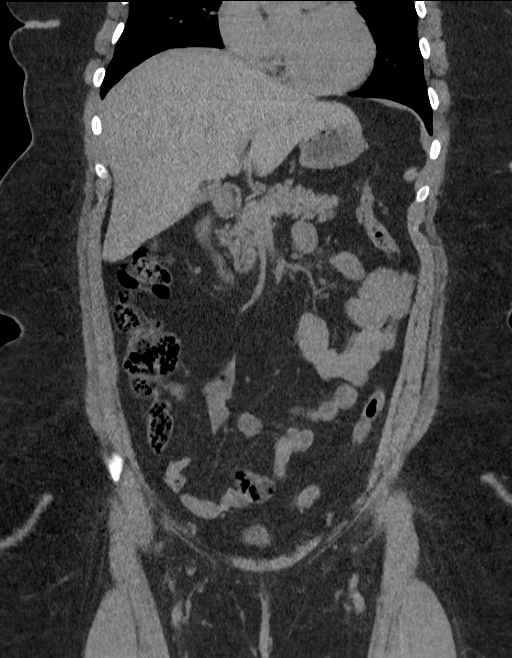
[im 105/189  soft-tissue]
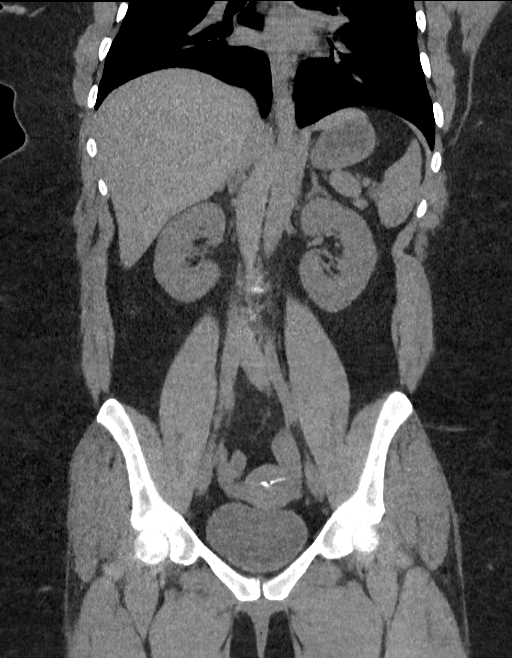

[16 of 46 positions shown; findings below may reference images not displayed]

FINDINGS: Evaluation of this exam is limited in the absence of intravenous
contrast.

Lower chest: The visualized lung bases are clear.

No intra-abdominal free air or free fluid.

Hepatobiliary: No focal liver abnormality is seen. No gallstones,
gallbladder wall thickening, or biliary dilatation.

Pancreas: Unremarkable. No pancreatic ductal dilatation or
surrounding inflammatory changes.

Spleen: Normal in size without focal abnormality.

Adrenals/Urinary Tract: The adrenal glands unremarkable. The
kidneys, visualized ureters, and urinary bladder appear
unremarkable.

Stomach/Bowel: There is no bowel obstruction or active inflammation.
The appendix is normal.

Vascular/Lymphatic: The abdominal aorta and IVC are grossly
unremarkable on this noncontrast CT. No portal venous gas. There is
no adenopathy.

Reproductive: The uterus is anteverted and grossly unremarkable. An
intrauterine device is noted. No adnexal masses.

Other: None

Musculoskeletal: No acute or significant osseous findings.
IMPRESSION: No acute intra-abdominal or pelvic pathology. No hydronephrosis or
nephrolithiasis.
